# Patient Record
Sex: Female | Born: 1945
Health system: Southern US, Community
[De-identification: ages and names within clinical notes are randomized; demographics above are authoritative.]

## PROBLEM LIST (undated history)

## (undated) DIAGNOSIS — E785 Hyperlipidemia, unspecified: Secondary | ICD-10-CM

## (undated) DIAGNOSIS — I359 Nonrheumatic aortic valve disorder, unspecified: Secondary | ICD-10-CM

## (undated) DIAGNOSIS — R0683 Snoring: Secondary | ICD-10-CM

## (undated) DIAGNOSIS — I10 Essential (primary) hypertension: Secondary | ICD-10-CM

## (undated) DIAGNOSIS — L509 Urticaria, unspecified: Secondary | ICD-10-CM

## (undated) DIAGNOSIS — G43909 Migraine, unspecified, not intractable, without status migrainosus: Secondary | ICD-10-CM

## (undated) DIAGNOSIS — F329 Major depressive disorder, single episode, unspecified: Secondary | ICD-10-CM

## (undated) DIAGNOSIS — M1990 Unspecified osteoarthritis, unspecified site: Secondary | ICD-10-CM

## (undated) DIAGNOSIS — K644 Residual hemorrhoidal skin tags: Secondary | ICD-10-CM

## (undated) DIAGNOSIS — G4733 Obstructive sleep apnea (adult) (pediatric): Secondary | ICD-10-CM

## (undated) DIAGNOSIS — F32A Depression, unspecified: Secondary | ICD-10-CM

## (undated) DIAGNOSIS — R413 Other amnesia: Secondary | ICD-10-CM

## (undated) DIAGNOSIS — G4730 Sleep apnea, unspecified: Secondary | ICD-10-CM

## (undated) DIAGNOSIS — K219 Gastro-esophageal reflux disease without esophagitis: Secondary | ICD-10-CM

## (undated) DIAGNOSIS — Z95818 Presence of other cardiac implants and grafts: Secondary | ICD-10-CM

## (undated) DIAGNOSIS — R234 Changes in skin texture: Secondary | ICD-10-CM

## (undated) HISTORY — PX: FACIAL COSMETIC SURGERY: SHX629

## (undated) HISTORY — DX: Nonrheumatic aortic valve disorder, unspecified: I35.9

## (undated) HISTORY — DX: Other amnesia: R41.3

## (undated) HISTORY — DX: Hyperlipidemia, unspecified: E78.5

## (undated) HISTORY — DX: Migraine, unspecified, not intractable, without status migrainosus: G43.909

## (undated) HISTORY — PX: BLADDER SURGERY: SHX569

## (undated) HISTORY — DX: Residual hemorrhoidal skin tags: K64.4

## (undated) HISTORY — DX: Snoring: R06.83

## (undated) HISTORY — PX: TOTAL ABDOMINAL HYSTERECTOMY: SHX209

## (undated) HISTORY — DX: Unspecified osteoarthritis, unspecified site: M19.90

## (undated) HISTORY — PX: HAMMER TOE SURGERY: SHX385

## (undated) HISTORY — PX: TUBAL LIGATION: SHX77

## (undated) HISTORY — DX: Gastro-esophageal reflux disease without esophagitis: K21.9

## (undated) HISTORY — DX: Sleep apnea, unspecified: G47.30

## (undated) HISTORY — PX: TONSILLECTOMY: SUR1361

## (undated) HISTORY — DX: Obstructive sleep apnea (adult) (pediatric): G47.33

---
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
## 1967-10-18 DIAGNOSIS — K644 Residual hemorrhoidal skin tags: Secondary | ICD-10-CM

## 1967-10-18 HISTORY — DX: Residual hemorrhoidal skin tags: K64.4

## 1999-02-23 ENCOUNTER — Encounter: Payer: Self-pay | Admitting: Gastroenterology

## 1999-07-06 ENCOUNTER — Other Ambulatory Visit: Admission: RE | Admit: 1999-07-06 | Discharge: 1999-07-06 | Payer: Self-pay | Admitting: Obstetrics and Gynecology

## 2000-03-29 ENCOUNTER — Encounter: Admission: RE | Admit: 2000-03-29 | Discharge: 2000-03-29 | Payer: Self-pay | Admitting: *Deleted

## 2000-03-29 ENCOUNTER — Encounter: Payer: Self-pay | Admitting: *Deleted

## 2000-11-20 ENCOUNTER — Other Ambulatory Visit: Admission: RE | Admit: 2000-11-20 | Discharge: 2000-11-20 | Payer: Self-pay | Admitting: Obstetrics and Gynecology

## 2001-04-02 ENCOUNTER — Emergency Department (HOSPITAL_COMMUNITY): Admission: EM | Admit: 2001-04-02 | Discharge: 2001-04-02 | Payer: Self-pay | Admitting: Emergency Medicine

## 2001-04-02 ENCOUNTER — Encounter: Payer: Self-pay | Admitting: Obstetrics and Gynecology

## 2001-04-11 ENCOUNTER — Encounter: Admission: RE | Admit: 2001-04-11 | Discharge: 2001-04-11 | Payer: Self-pay | Admitting: Obstetrics and Gynecology

## 2001-04-11 ENCOUNTER — Encounter: Payer: Self-pay | Admitting: Obstetrics and Gynecology

## 2002-01-22 ENCOUNTER — Other Ambulatory Visit: Admission: RE | Admit: 2002-01-22 | Discharge: 2002-01-22 | Payer: Self-pay | Admitting: Obstetrics and Gynecology

## 2002-05-12 ENCOUNTER — Encounter: Payer: Self-pay | Admitting: Emergency Medicine

## 2002-05-12 ENCOUNTER — Emergency Department (HOSPITAL_COMMUNITY): Admission: EM | Admit: 2002-05-12 | Discharge: 2002-05-12 | Payer: Self-pay | Admitting: Emergency Medicine

## 2002-08-01 ENCOUNTER — Encounter: Admission: RE | Admit: 2002-08-01 | Discharge: 2002-08-01 | Payer: Self-pay | Admitting: Obstetrics and Gynecology

## 2002-08-01 ENCOUNTER — Encounter: Payer: Self-pay | Admitting: Obstetrics and Gynecology

## 2003-05-22 ENCOUNTER — Other Ambulatory Visit: Admission: RE | Admit: 2003-05-22 | Discharge: 2003-05-22 | Payer: Self-pay | Admitting: Obstetrics and Gynecology

## 2004-05-07 ENCOUNTER — Encounter: Admission: RE | Admit: 2004-05-07 | Discharge: 2004-05-07 | Payer: Self-pay | Admitting: Obstetrics and Gynecology

## 2004-07-14 ENCOUNTER — Other Ambulatory Visit: Admission: RE | Admit: 2004-07-14 | Discharge: 2004-07-14 | Payer: Self-pay | Admitting: Obstetrics and Gynecology

## 2005-06-29 ENCOUNTER — Encounter: Admission: RE | Admit: 2005-06-29 | Discharge: 2005-06-29 | Payer: Self-pay | Admitting: Obstetrics and Gynecology

## 2005-08-04 ENCOUNTER — Emergency Department (HOSPITAL_COMMUNITY): Admission: EM | Admit: 2005-08-04 | Discharge: 2005-08-04 | Payer: Self-pay | Admitting: Emergency Medicine

## 2006-04-13 ENCOUNTER — Other Ambulatory Visit: Admission: RE | Admit: 2006-04-13 | Discharge: 2006-04-13 | Payer: Self-pay | Admitting: Obstetrics and Gynecology

## 2006-07-03 ENCOUNTER — Encounter: Admission: RE | Admit: 2006-07-03 | Discharge: 2006-07-03 | Payer: Self-pay | Admitting: Obstetrics and Gynecology

## 2007-10-03 ENCOUNTER — Encounter: Admission: RE | Admit: 2007-10-03 | Discharge: 2007-10-03 | Payer: Self-pay | Admitting: Obstetrics and Gynecology

## 2007-10-18 HISTORY — PX: TOTAL KNEE ARTHROPLASTY: SHX125

## 2007-10-24 ENCOUNTER — Other Ambulatory Visit: Admission: RE | Admit: 2007-10-24 | Discharge: 2007-10-24 | Payer: Self-pay | Admitting: Obstetrics and Gynecology

## 2007-11-01 ENCOUNTER — Encounter: Admission: RE | Admit: 2007-11-01 | Discharge: 2007-11-01 | Payer: Self-pay | Admitting: Neurology

## 2007-11-22 ENCOUNTER — Encounter: Admission: RE | Admit: 2007-11-22 | Discharge: 2007-11-22 | Payer: Self-pay | Admitting: Neurology

## 2008-01-28 ENCOUNTER — Encounter: Admission: RE | Admit: 2008-01-28 | Discharge: 2008-01-28 | Payer: Self-pay | Admitting: Neurology

## 2008-08-13 ENCOUNTER — Inpatient Hospital Stay (HOSPITAL_COMMUNITY): Admission: RE | Admit: 2008-08-13 | Discharge: 2008-08-16 | Payer: Self-pay | Admitting: Orthopedic Surgery

## 2008-11-12 ENCOUNTER — Ambulatory Visit: Payer: Self-pay | Admitting: Cardiology

## 2008-11-17 ENCOUNTER — Ambulatory Visit: Payer: Self-pay

## 2008-11-27 ENCOUNTER — Encounter: Payer: Self-pay | Admitting: Cardiology

## 2008-11-27 ENCOUNTER — Ambulatory Visit: Payer: Self-pay

## 2008-12-17 ENCOUNTER — Ambulatory Visit: Payer: Self-pay | Admitting: Cardiology

## 2009-01-22 ENCOUNTER — Encounter (INDEPENDENT_AMBULATORY_CARE_PROVIDER_SITE_OTHER): Payer: Self-pay | Admitting: *Deleted

## 2009-03-17 DIAGNOSIS — E785 Hyperlipidemia, unspecified: Secondary | ICD-10-CM | POA: Insufficient documentation

## 2009-03-18 ENCOUNTER — Ambulatory Visit: Payer: Self-pay | Admitting: Pulmonary Disease

## 2009-03-18 DIAGNOSIS — R0989 Other specified symptoms and signs involving the circulatory and respiratory systems: Secondary | ICD-10-CM

## 2009-03-18 DIAGNOSIS — R0609 Other forms of dyspnea: Secondary | ICD-10-CM

## 2009-04-30 ENCOUNTER — Other Ambulatory Visit: Admission: RE | Admit: 2009-04-30 | Discharge: 2009-04-30 | Payer: Self-pay | Admitting: Obstetrics and Gynecology

## 2009-04-30 ENCOUNTER — Ambulatory Visit: Payer: Self-pay | Admitting: Obstetrics and Gynecology

## 2009-04-30 ENCOUNTER — Encounter: Payer: Self-pay | Admitting: Obstetrics and Gynecology

## 2009-05-28 ENCOUNTER — Encounter: Admission: RE | Admit: 2009-05-28 | Discharge: 2009-05-28 | Payer: Self-pay | Admitting: Obstetrics and Gynecology

## 2009-07-17 ENCOUNTER — Encounter: Payer: Self-pay | Admitting: Gastroenterology

## 2009-07-21 ENCOUNTER — Ambulatory Visit: Payer: Self-pay | Admitting: Gastroenterology

## 2009-09-14 ENCOUNTER — Encounter: Payer: Self-pay | Admitting: Gastroenterology

## 2010-06-14 ENCOUNTER — Ambulatory Visit: Payer: Self-pay | Admitting: Pulmonary Disease

## 2010-06-14 DIAGNOSIS — M5030 Other cervical disc degeneration, unspecified cervical region: Secondary | ICD-10-CM | POA: Insufficient documentation

## 2010-06-14 DIAGNOSIS — G43909 Migraine, unspecified, not intractable, without status migrainosus: Secondary | ICD-10-CM | POA: Insufficient documentation

## 2010-06-14 DIAGNOSIS — R059 Cough, unspecified: Secondary | ICD-10-CM | POA: Insufficient documentation

## 2010-06-14 DIAGNOSIS — K5730 Diverticulosis of large intestine without perforation or abscess without bleeding: Secondary | ICD-10-CM | POA: Insufficient documentation

## 2010-06-14 DIAGNOSIS — M1710 Unilateral primary osteoarthritis, unspecified knee: Secondary | ICD-10-CM

## 2010-06-14 DIAGNOSIS — I359 Nonrheumatic aortic valve disorder, unspecified: Secondary | ICD-10-CM | POA: Insufficient documentation

## 2010-06-14 DIAGNOSIS — R1312 Dysphagia, oropharyngeal phase: Secondary | ICD-10-CM | POA: Insufficient documentation

## 2010-06-14 DIAGNOSIS — K219 Gastro-esophageal reflux disease without esophagitis: Secondary | ICD-10-CM | POA: Insufficient documentation

## 2010-06-14 DIAGNOSIS — M179 Osteoarthritis of knee, unspecified: Secondary | ICD-10-CM | POA: Insufficient documentation

## 2010-06-14 DIAGNOSIS — R05 Cough: Secondary | ICD-10-CM

## 2010-06-16 ENCOUNTER — Ambulatory Visit (HOSPITAL_COMMUNITY): Admission: RE | Admit: 2010-06-16 | Discharge: 2010-06-16 | Payer: Self-pay | Admitting: Pulmonary Disease

## 2010-06-16 ENCOUNTER — Encounter: Payer: Self-pay | Admitting: Pulmonary Disease

## 2010-07-01 ENCOUNTER — Encounter: Admission: RE | Admit: 2010-07-01 | Discharge: 2010-07-01 | Payer: Self-pay | Admitting: Internal Medicine

## 2010-07-07 ENCOUNTER — Encounter: Payer: Self-pay | Admitting: Pulmonary Disease

## 2010-11-07 ENCOUNTER — Encounter: Payer: Self-pay | Admitting: Obstetrics and Gynecology

## 2010-11-07 ENCOUNTER — Encounter: Payer: Self-pay | Admitting: Neurology

## 2010-11-14 ENCOUNTER — Encounter: Payer: Self-pay | Admitting: Pulmonary Disease

## 2010-11-15 ENCOUNTER — Ambulatory Visit
Admission: RE | Admit: 2010-11-15 | Discharge: 2010-11-15 | Payer: Self-pay | Source: Home / Self Care | Attending: Pulmonary Disease | Admitting: Pulmonary Disease

## 2010-11-15 DIAGNOSIS — G4733 Obstructive sleep apnea (adult) (pediatric): Secondary | ICD-10-CM | POA: Insufficient documentation

## 2010-11-16 NOTE — Assessment & Plan Note (Addendum)
Summary: aspiration/ok per SN/Dr. Norval Gable wife/apc   Primary Care Provider:  Dr. Timothy Lasso   CC:  Add-on for ?aspiration episode last PM....  History of Present Illness: 65 y/o WF, wife of Dr. Sheryn Guerrero, who is a primary care patient of DrRusso's followed for GERD, Hyperlipidemia, and DJD... DrPatterson called this AM asking me to check his wife after a choking episode last night... they were at a restaurant & she choked on a piece of dry chicken resulting in a severe coughing paroxysm but at no time did she have stridor or require a heimlich maneuver etc... she describes continued coughing thru the night, w/ choking & gagging, assoc w/ incr SOB & some wheezing noted... she has not been producing any sputum or food particles, no hemoptysis, etc... on further questioning she describes several months of intermittent choking on food (ave once per week) & occas coughs after swallowing pills... she feels like there is something in her upper esoph & she clears her throat alot, she says...  she has been on Aciphex daily for reflux symptoms and saw ENT in the past for this... however she notes that her swallowing is OK, & denies regurgitation of food etc... she also has a hx of CSpine disc dis w/ prev epid steroid shots, but no know osteopytes that could impact her swallowing... she is an ex-smoker having smoked off & on for yrs & quit in 2005.   Current Problems:   SNORING (ICD-786.09) - prev eval 6/10 by DrClance.  COUGH (ICD-786.2) **SEE ABOVE**  ~  CXR 8/11 showed clear lungs, NAD...  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  DIVERTICULOSIS OF COLON (ICD-562.10) - last colonoscopy 10/10 by DrStark showed mild divertics, otherw neg...  DEGENERATIVE JOINT DISEASE (ICD-715.90) - she takes  CELEBREX 200mg  Prn and had a right TKR by DrAlusio 10/09  DEGENERATIVE DISC DISEASE, CERVICAL SPINE (ICD-722.4) - prev eval by Autumn Patty & she received epid steroid injections by IR in 2009  Hx of MIGRAINE HEADACHE (ICD-346.90) - prev eval by DrLove   Preventive Screening-Counseling & Management  Alcohol-Tobacco     Smoking Status: quit     Packs/Day: 1.0     Year Started: 1962     Year Quit: 2005  Allergies (verified): No Known Drug Allergies  Comments:  Nurse/Medical Assistant: The patient's medications and allergies were reviewed with the patient and were updated in the Medication and Allergy Lists.  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 Mother  Alzheimers, hyperlipidemia Father  High HDL, type II DM Two brother Factor IX deficiency  allergies: children, father, brother asthma: grandchildren, father rheumatism: maternal grandmother   Social History: Reviewed history from 03/18/2009 and no changes required. Married, 3 children Former smoker.  started at age 81.  smoked "on and off" approx 1 ppd.  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 pt lives with husband, Dr. Sheryn Guerrero.    Review of Systems      See HPI  The patient complains of prolonged cough.  The patient denies anorexia, fever, weight loss, weight gain, vision loss, decreased hearing, hoarseness, chest pain, syncope, dyspnea on exertion, peripheral edema, headaches, hemoptysis,  abdominal pain, melena, hematochezia, severe indigestion/heartburn, hematuria, incontinence, muscle weakness, suspicious skin lesions, transient blindness, difficulty walking, depression, unusual weight change, abnormal bleeding, enlarged lymph nodes, and angioedema.    Vital Signs:  Patient profile:   64 year old female Height:      66 inches Weight:      153 pounds BMI:     24.78 O2 Sat:      98 % on Room air Temp:     100.4 degrees F oral Pulse rate:   83 / minute BP sitting:   116 / 74  (left arm) Cuff size:   regular  Vitals Entered By: Randell Loop CMA (June 14, 2010 12:07 PM)  O2 Sat at Rest %:  98 O2 Flow:  Room air CC: Add-on for ?aspiration episode last PM... Is Patient Diabetic? No Pain Assessment Patient in pain? no      Comments meds updated today with pt   Physical Exam  Additional Exam:  WD, WN, 65 y/o WF in NAD... GENERAL:  Alert & oriented; pleasant & cooperative... HEENT:  [place]/AT, EOM-wnl, PERRLA, EACs-clear, TMs-wnl, NOSE-clear, THROAT- sl red, no lesions. NECK:  Supple w/ fairROM; no JVD; normal carotid impulses w/o bruits; no thyromegaly or nodules palpated; no lymphadenopathy. CHEST:  Clear to P & A; without wheezes/ rales/ or rhonchi heard... able to get good deep breath w/o coughing. HEART:  Regular Rhythm; without murmurs/ rubs/ or gallops detected... ABDOMEN:  Soft & nontender; normal bowel sounds; no organomegaly or masses palpated... EXT:  s/p right TKR, mild arthritic changes; no varicose veins/ venous insuffic/ or edema. NEURO:  CN's intact; motor testing normal; sensory testing normal; gait normal & balance OK. DERM:  No lesions noted; no rash etc...    CXR  Procedure date:  06/14/2010  Findings:      CHEST - 2 VIEW Comparison: None.   Findings: The lungs are clear.  Heart size is normal.  No pleural effusion or focal bony abnormality.   IMPRESSION: No acute disease.   Read By:  Charyl Dancer,  M.D.   Impression &  Recommendations:  Problem # 1:  Choking episode w/ residual cough>>> By her hx she had a severe choking episode last PM & she may have aspirated a sm piece of the chicken; but she has min cough at present and no airway inflamm signs w/ clear exam, able to take deep breath w/o cough, clear CXR, etc... we discussed pos bronchoscopy but I doubt any residual material in her tracheobronch tree & we agreed to hold off on this procedure for now... we will pursue eval of her dysphagia, and write a perscription for Prn Tussionex... Other Orders: T-2 View CXR (71020TC)  Problem # 2:  DYSPHAGIA OROPHARYNGEAL PHASE (ZOX-096.04) She has several months worth of intermittent dysphagia, choking, ?swallowing difficulty & we will proceed w/ MBS first... we left open the poss that she may require an EGD from DrStark... Orders: No Charge Patient Arrived (NCPA0) (NCPA0) Mod Barium Swallow (Mod Barium Swallow)  Problem # 3:  GERD (ICD-530.81) REC to increase the Aciphex to Bid in the interim... Her updated medication list for this problem includes:    Aciphex 20 Mg Tbec (Rabeprazole sodium) .Marland Kitchen... Take 1 tablet by mouth twice daily- 30 min before breakfast & dinner...  Problem # 4:  DIVERTICULOSIS OF COLON (ICD-562.10) She had a neg colonoscopy 10/10 by DrStark...  Problem # 5:  HYPERLIPIDEMIA (ICD-272.4) Followed by Etheleen Sia... Her updated medication list for this problem includes:    Crestor 40 Mg Tabs (Rosuvastatin calcium) .Marland Kitchen... Take 1 tablet by mouth once a day  Problem # 6:  DEGENERATIVE JOINT DISEASE (ICD-715.90) She is s/p right TKR in 2009, and has known cervical disc disease as well... Her updated medication list for this problem includes:    Celebrex 200 Mg Caps (Celecoxib) .Marland Kitchen... 1 two times a day  Complete Medication List: 1)  Crestor 40 Mg Tabs (Rosuvastatin calcium) .... Take 1 tablet by mouth once a day 2)  Aciphex 20 Mg Tbec (Rabeprazole sodium) .... Take 1 tablet by mouth twice daily- 30  min before breakfast & dinner... 3)  Celebrex 200 Mg Caps (Celecoxib) .Marland Kitchen.. 1 two times a day 4)  Fluoxetine Hcl 40 Mg Caps (Fluoxetine hcl) .... Take 1 tablet by mouth once a day 5)  Budeprion Xl 150 Mg Xr24h-tab (Bupropion hcl) .... Take 1 tablet by mouth once a day 6)  Estradiol Patch  .... Use as directed 7)  Tussionex Pennkinetic Er 8-10 Mg/73ml Lqcr (Chlorpheniramine-hydrocodone) .Marland Kitchen.. 1 tsp by mouth every 12 h as needed for cough  Patient Instructions: 1)  Today we updated your med list- see below.... 2)  Let's increase your Aciphex to one tab twice daily- taken 30 min before breakfast & dinner... 3)  We will sched a modified barium swallow test to evaluate your swallowing mechanism & call you w/ the results when avail...    Appended Document: Orders Update    Clinical Lists Changes  Orders: Added new Referral order of Misc. Referral (Misc. Ref) - Signed

## 2010-11-19 ENCOUNTER — Ambulatory Visit (INDEPENDENT_AMBULATORY_CARE_PROVIDER_SITE_OTHER): Payer: Commercial Managed Care - PPO | Admitting: Pulmonary Disease

## 2010-11-19 ENCOUNTER — Encounter: Payer: Self-pay | Admitting: Pulmonary Disease

## 2010-11-19 DIAGNOSIS — G4733 Obstructive sleep apnea (adult) (pediatric): Secondary | ICD-10-CM

## 2010-11-24 NOTE — Assessment & Plan Note (Signed)
Summary: home sleep testing with AHI 15/hr   Primary Provider/Referring Provider:  Dr. Timothy Lasso    History of Present Illness: Home Sleep Testing Report  The pt underwent home sleep testing with a type 3 device, with monitoring of airflow, effort, oxygen saturation, and heart rate.  The pt's data and tracings have been reviewed, with the following findings:  1) flow evaluation period of 7hrs and , and saturation evaluation period of 8hrs and . 2) the pt had 69 obstructive apneas, 16 central apneas, and 28 obstructive hypopneas.  This gave her         an AHI 15/hr. 3) there was oxygen desaturation as low as 74% transiently, and she spent during the night less       than or equal to 88%.  Allergies: No Known Drug Allergies   Impression & Recommendations:  Problem # 1:  OBSTRUCTIVE SLEEP APNEA (ICD-327.23)  mild osa documented by her recent home sleep study, with AHI 15/hr with desat as low as 74%.  Treatments for this can include a trial of weight loss if applicable, ua surgery, dental appliance, and cpap.  Clinical correlation is suggested.    Other Orders: Sleep Std Airflow/Heartrate and O2 SAT unattended ([id number])  Appended Document: home sleep testing with AHI 15/hr megan, please call pt and have her come in for ov to discuss her sleep study and various treatment options.  Appended Document: home sleep testing with AHI 15/hr [id number]  Appended Document: home sleep testing with AHI 15/hr called and spoke with pt. pt scheduled to see [facility] Friday 11-19-2010 at 11:30

## 2010-12-08 NOTE — Assessment & Plan Note (Signed)
Summary: ov to discuss sleep study results/mg   Vital Signs:  Patient profile:   65 year old female Height:      66 inches Weight:      157.38 pounds BMI:     25.49 O2 Sat:      99 % on Room air Temp:     98.8 degrees F oral Pulse rate:   82 / minute BP sitting:   122 / 78  (left arm) Cuff size:   regular  Vitals Entered By: Arman Filter LPN (November 19, 2010 11:46 AM)  O2 Flow:  Room air CC: Ov to discuss apnea link results.  Comments Medications reviewed with patient Arman Filter LPN  November 19, 2010 11:48 AM    Copy to:  Love Primary Provider/Referring Provider:  Kriste Basque  CC:  Ov to discuss apnea link results. .  History of Present Illness: the pt comes in today for f/u of her recent sleep study.  She was found to have mild osa,  with AHI 15/hr and desat to 74%.  I have reviewed the results with her and husband, and answered all of their questions.    Current Medications (verified): 1)  Crestor 40 Mg Tabs (Rosuvastatin Calcium) .... Take 1 Tablet By Mouth Once A Day 2)  Aciphex 20 Mg Tbec (Rabeprazole Sodium) .... Take 1 Tablet By Mouth Twice Daily- 30 Min Before Breakfast & Dinner... 3)  Celebrex 200 Mg Caps (Celecoxib) .Marland Kitchen.. 1 Two Times A Day 4)  Fluoxetine Hcl 40 Mg Caps (Fluoxetine Hcl) .... Take 1 Tablet By Mouth Once A Day 5)  Budeprion Xl 150 Mg Xr24h-Tab (Bupropion Hcl) .... Take 1 Tablet By Mouth Once A Day 6)  Estradiol Patch .... Use As Directed  Allergies (verified): No Known Drug Allergies  Review of Systems       The patient complains of acid heartburn, indigestion, and joint stiffness or pain.  The patient denies shortness of breath with activity, shortness of breath at rest, productive cough, non-productive cough, coughing up blood, chest pain, irregular heartbeats, loss of appetite, weight change, abdominal pain, difficulty swallowing, sore throat, tooth/dental problems, headaches, nasal congestion/difficulty breathing through nose, sneezing,  itching, ear ache, anxiety, depression, hand/feet swelling, rash, change in color of mucus, and fever.    Physical Exam  General:  wd female in nad. Nose:  mild septal deviation to left with narrowing, but patent Mouth:  mild elongation of soft palate, but normal uvula Neurologic:  alert, does not appear overly sleepy   Impression & Recommendations:  Problem # 1:  OBSTRUCTIVE SLEEP APNEA (ICD-327.23) the pt has mild osa by her recent sleep study, but is clearly symptomatic with sleep disruption, daytime sleepiness, and some memory issues that may be related.  I have reviewed the various treatment options, including weight reduction, upper airway surgery, dental appliance, and cpap.  After discussing at length, they have decided to start with cpap and see how she does.  Will start at a low pressure level for better tolerance, and have reviewed the various types of masks as well.   Other Orders: No Charge Patient Arrived (NCPA0) (NCPA0) DME Referral (DME)  Patient Instructions: 1)  will set up on cpap.  Please call if tolerance issues 2)  followup with me in 5 weeks.   Orders Added: 1)  No Charge Patient Arrived (NCPA0) [NCPA0] 2)  DME Referral [DME]

## 2010-12-24 ENCOUNTER — Encounter: Payer: Self-pay | Admitting: Pulmonary Disease

## 2010-12-24 ENCOUNTER — Ambulatory Visit: Payer: Commercial Managed Care - PPO | Admitting: Pulmonary Disease

## 2011-01-05 ENCOUNTER — Ambulatory Visit: Payer: Commercial Managed Care - PPO | Admitting: Pulmonary Disease

## 2011-01-17 ENCOUNTER — Ambulatory Visit (INDEPENDENT_AMBULATORY_CARE_PROVIDER_SITE_OTHER): Payer: Commercial Managed Care - PPO | Admitting: Gastroenterology

## 2011-01-17 ENCOUNTER — Other Ambulatory Visit (INDEPENDENT_AMBULATORY_CARE_PROVIDER_SITE_OTHER): Payer: Commercial Managed Care - PPO

## 2011-01-17 ENCOUNTER — Other Ambulatory Visit: Payer: Self-pay | Admitting: Gastroenterology

## 2011-01-17 ENCOUNTER — Encounter: Payer: Self-pay | Admitting: Gastroenterology

## 2011-01-17 VITALS — BP 128/64 | HR 72 | Ht 65.0 in | Wt 157.0 lb

## 2011-01-17 DIAGNOSIS — R197 Diarrhea, unspecified: Secondary | ICD-10-CM

## 2011-01-17 LAB — HEPATIC FUNCTION PANEL
Albumin: 3.9 g/dL (ref 3.5–5.2)
Total Protein: 6.7 g/dL (ref 6.0–8.3)

## 2011-01-17 LAB — BASIC METABOLIC PANEL
BUN: 13 mg/dL (ref 6–23)
CO2: 27 mEq/L (ref 19–32)
Calcium: 9.4 mg/dL (ref 8.4–10.5)
Creatinine, Ser: 1.1 mg/dL (ref 0.4–1.2)
GFR: 56.03 mL/min — ABNORMAL LOW (ref >60.00)
Glucose, Bld: 106 mg/dL — ABNORMAL HIGH (ref 70–99)
Sodium: 138 mEq/L (ref 135–145)

## 2011-01-17 NOTE — Patient Instructions (Signed)
Go directly to the basement today to have your labs drawn.  Follow instructions on [facility] cards and mail back to [place] when finished.

## 2011-01-17 NOTE — Progress Notes (Signed)
History of Present Illness:  Angelica Guerrero is a 65 year old female who relates that he six-month history of urgent, watery diarrhea associated with lower abdominal crampy pain and generalized abdominal bloating. Her symptoms generally occur 30 minutes after meals. She states she is extremely sensitive to sucralose, sorbitol and similar alcohol sugars. Even eating small amounts of these products leads to significant diarrhea that is not controlled even with Imodium. She has recently noted sensitivity to lactose as well which is new over the past few months. She notes no rectal bleeding, weight loss, change in stool caliber, nausea, vomiting, anorexia, fevers or chills. She took a course of Septra several months ago without any change in symptoms. Only recent travel has been to [place]   Current Medications, Allergies, Past Medical History, Past Surgical History, Family History and Social History were reviewed in [employer] record.  Physical Exam: General: Well developed , well nourished, no acute distress Head: Normocephalic and atraumatic Eyes:  sclerae anicteric, EOMI Ears: Normal auditory acuity Mouth: No deformity or lesions Lungs: Clear throughout to auscultation Heart: Regular rate and rhythm; no murmurs, rubs or bruits Abdomen: Soft, non tender and non distended. No masses, hepatosplenomegaly or hernias noted. Normal Bowel sounds Musculoskeletal: Symmetrical with no gross deformities  Pulses:  Normal pulses noted Extremities: No clubbing, cyanosis, edema or deformities noted Neurological: Alert oriented x 4, grossly nonfocal Psychological:  Alert and cooperative. Normal mood and affect  Assessment and Recommendations:

## 2011-01-17 NOTE — Assessment & Plan Note (Signed)
Worsening diarrhea for 6 months. A component of her symptoms is clearly related to sucralose and alcohol sugars. She is attempting to strictly avoid these products. She may have a component of lactose intolerance as well and is advised to continue a lactose-free diet and/or to use Lactaid pills. Rule out a superimposed chronic intestinal infection, such as Giardia, celiac disease, microscopic colitis and other disorders. See orders for stool studies blood work and stool Hemoccults. Consider a trial of metronidazole and a colonoscopy with biopsies to evaluate for microscopic colitis if the stool studies and blood work are unrevealing. Continue Imodium 3 times a day as needed for now. Will be in contact with her when her blood work and stool studies return to make plans for course of metronidazole or colonoscopy or both.

## 2011-01-18 ENCOUNTER — Encounter: Payer: Self-pay | Admitting: Gastroenterology

## 2011-01-18 LAB — GLIA (IGA/G) + TTG IGA
Gliadin IgA: 4.5 U/mL (ref <20)
Gliadin IgG: 7.1 U/mL (ref <20)
Tissue Transglutaminase Ab, IgA: 4.9 U/mL (ref <20)

## 2011-01-18 LAB — C-REACTIVE PROTEIN: CRP: 0.1 mg/dL (ref <0.6)

## 2011-01-19 ENCOUNTER — Encounter: Payer: Self-pay | Admitting: Gastroenterology

## 2011-01-19 ENCOUNTER — Telehealth: Payer: Self-pay

## 2011-01-19 ENCOUNTER — Other Ambulatory Visit: Payer: Self-pay | Admitting: Gastroenterology

## 2011-01-19 ENCOUNTER — Other Ambulatory Visit: Payer: Commercial Managed Care - PPO

## 2011-01-19 DIAGNOSIS — R197 Diarrhea, unspecified: Secondary | ICD-10-CM

## 2011-01-19 NOTE — Telephone Encounter (Signed)
Spoke with patient and scheduled her for a Colonoscopy on 01/27/11 at 8:00am. Pt verbalized understanding. Pt's husband (Dr. Sheryn Bison) will take home paper work for her to fill out and return back to the office.

## 2011-01-20 LAB — CLOSTRIDIUM DIFFICILE BY PCR: Toxigenic C. Difficile by PCR: NOT DETECTED

## 2011-01-20 LAB — OVA AND PARASITE SCREEN: OP: NONE SEEN

## 2011-01-20 LAB — GIARDIA/CRYPTOSPORIDIUM (EIA): Cryptosporidium Screen (EIA): NEGATIVE

## 2011-01-21 LAB — FECAL FAT QUALITATIVE
Free Fatty Acids: NORMAL
NEUTRAL FAT: NORMAL

## 2011-01-23 LAB — STOOL CULTURE

## 2011-01-26 ENCOUNTER — Encounter: Payer: Self-pay | Admitting: Gastroenterology

## 2011-01-27 ENCOUNTER — Encounter: Payer: Self-pay | Admitting: Gastroenterology

## 2011-01-27 ENCOUNTER — Other Ambulatory Visit: Payer: Commercial Managed Care - PPO | Admitting: Gastroenterology

## 2011-01-27 ENCOUNTER — Ambulatory Visit (AMBULATORY_SURGERY_CENTER): Payer: 59 | Admitting: Gastroenterology

## 2011-01-27 VITALS — BP 130/72 | HR 69 | Temp 98.2°F | Resp 16 | Ht 65.5 in | Wt 153.0 lb

## 2011-01-27 DIAGNOSIS — R197 Diarrhea, unspecified: Secondary | ICD-10-CM

## 2011-01-27 DIAGNOSIS — K5730 Diverticulosis of large intestine without perforation or abscess without bleeding: Secondary | ICD-10-CM

## 2011-01-27 DIAGNOSIS — R198 Other specified symptoms and signs involving the digestive system and abdomen: Secondary | ICD-10-CM

## 2011-01-27 MED ORDER — SODIUM CHLORIDE 0.9 % IV SOLN: 500.0000 mL | INTRAVENOUS | Status: DC

## 2011-01-28 ENCOUNTER — Telehealth: Payer: Self-pay | Admitting: *Deleted

## 2011-01-28 NOTE — Telephone Encounter (Signed)

## 2011-02-01 ENCOUNTER — Encounter: Payer: Self-pay | Admitting: Gastroenterology

## 2011-02-01 ENCOUNTER — Encounter: Payer: Self-pay | Admitting: Pulmonary Disease

## 2011-02-02 ENCOUNTER — Encounter: Payer: Self-pay | Admitting: Pulmonary Disease

## 2011-02-02 ENCOUNTER — Ambulatory Visit (INDEPENDENT_AMBULATORY_CARE_PROVIDER_SITE_OTHER): Payer: 59 | Admitting: Pulmonary Disease

## 2011-02-02 ENCOUNTER — Telehealth: Payer: Self-pay

## 2011-02-02 VITALS — BP 110/68 | HR 69 | Temp 98.4°F | Ht 66.0 in | Wt 164.0 lb

## 2011-02-02 DIAGNOSIS — G4733 Obstructive sleep apnea (adult) (pediatric): Secondary | ICD-10-CM

## 2011-02-02 MED ORDER — BUDESONIDE 3 MG PO CP24: ORAL_CAPSULE | ORAL | Status: DC

## 2011-02-02 NOTE — Progress Notes (Signed)
  Subjective:    Patient ID: Angelica Guerrero, female    DOB: 1946/05/23, 65 y.o.   MRN: [id number]  HPI The pt comes in today for f/u of her mild osa.  She has been wearing cpap compliantly, and feels that it has helped her sleep and daytime alertness.  No issues with pressure or mask fit.  She is, however, bothered by the inconvenience with cpap.  She is considering referral for dental appliance.    Review of Systems  Constitutional: Negative for fever and unexpected weight change.  HENT: Negative for ear pain, nosebleeds, congestion, sore throat, rhinorrhea, sneezing, trouble swallowing, dental problem, postnasal drip and sinus pressure.   Eyes: Positive for redness and itching.  Respiratory: Negative for cough, chest tightness, shortness of breath and wheezing.   Cardiovascular: Negative for palpitations and leg swelling.  Gastrointestinal: Negative for nausea and vomiting.  Genitourinary: Negative for dysuria.  Musculoskeletal: Negative for joint swelling.  Skin: Negative for rash.  Neurological: Negative for headaches.  Hematological: Does not bruise/bleed easily.  Psychiatric/Behavioral: Negative for dysphoric mood. The patient is not nervous/anxious.        Objective:   Physical Exam Wd female in nad No skin breakdown or pressure necrosis from cpap mask  Alert, not sleepy, moves all 4        Assessment & Plan:

## 2011-02-02 NOTE — Assessment & Plan Note (Signed)
The pt is doing better with cpap, and feels she is sleeping better with improved daytime alertness.  However, she does feel is inconvenient, esp with travel, and would like to consider dental appliance as well.  I asked her to call me when she is ready, and will refer to Althea Grimmer.  In the meantime, will put machine on auto for the next 2 weeks to optimize pressure.

## 2011-02-02 NOTE — Telephone Encounter (Signed)
Spoke with patient and informed her that Dr. Russella Dar wants her to continue Entocort for anther 5 weeks and taper down to 6 mg in 3 weeks, then reduce to 3 mg after 1 week, then discontinue. Pt verbalized understanding and prescription was sent to [facility].

## 2011-02-02 NOTE — Patient Instructions (Signed)
Will get machine changed over to auto mode for the next few weeks to optimize your pressure.  I will call you with the results, but let us know if you prefer the automatic mode. Please call if you would like to consider a dental referral for an appliance followup with me in 6mos

## 2011-02-04 ENCOUNTER — Inpatient Hospital Stay (INDEPENDENT_AMBULATORY_CARE_PROVIDER_SITE_OTHER)
Admission: RE | Admit: 2011-02-04 | Discharge: 2011-02-04 | Disposition: A | Discharge status: Home / Self Care | Payer: 59 | Source: Ambulatory Visit | Attending: Family Medicine | Admitting: Family Medicine

## 2011-02-04 DIAGNOSIS — S81809A Unspecified open wound, unspecified lower leg, initial encounter: Secondary | ICD-10-CM

## 2011-02-26 ENCOUNTER — Other Ambulatory Visit: Payer: Self-pay | Admitting: Pulmonary Disease

## 2011-02-26 DIAGNOSIS — G4733 Obstructive sleep apnea (adult) (pediatric): Secondary | ICD-10-CM

## 2011-03-01 NOTE — Op Note (Signed)
NAMEJOSIEPHINE, Angelica Guerrero            ACCOUNT NO.:  [id number]   MEDICAL RECORD NO.:  [id number]          PATIENT TYPE:  INP   LOCATION:  0010                         FACILITY:  [facility]   PHYSICIAN:  Ollen Gross, M.D.    DATE OF BIRTH:  [birth year]   DATE OF PROCEDURE:  08/13/2008  DATE OF DISCHARGE:                               OPERATIVE REPORT   PREOPERATIVE DIAGNOSIS:  Osteoarthritis right knee.   POSTOPERATIVE DIAGNOSIS:  Osteoarthritis right knee.   PROCEDURE:  Right total knee arthroplasty.   SURGEON:  Ollen Gross, M.D.   ASSISTANT:  Alexzandrew L. Perkins, P.A.C.   ANESTHESIA:  Spinal.   ESTIMATED BLOOD LOSS:  Minimal.   DRAIN:  None.   TOURNIQUET TIME:  30 minutes at 300 mmHg.   COMPLICATIONS:  None.   CONDITION:  Stable to recovery.   BRIEF CLINICAL NOTE:  Angelica Guerrero is a 65 year old female who has end-  stage arthritis of the right knee with progressively worsening pain and  dysfunction.  She has failed nonoperative management and presents now  for right total knee arthroplasty.   PROCEDURE IN DETAIL:  After the successful administration of spinal  anesthetic a tourniquet was placed high on her right thigh and right  lower extremity prepped and draped in the usual sterile fashion.  Extremity was wrapped in Esmarch, knee flexed, tourniquet inflated to  300 mmHg.  Midline incision is made with a 10 blade through subcutaneous  tissue to the level of the extensor mechanism.  A fresh blade is used to  make a medial parapatellar arthrotomy.  Soft tissue over the proximal  medial tibia subperiosteally elevated to the joint line with the knife  and into the semimembranosus bursa with a Cobb elevator.  Soft tissue  laterally is elevated with attention being paid to avoid the patellar  tendon on the tibial tubercle.  The patella was subluxed laterally, knee  flexed 90 degrees and ACL and PCL removed.  Drill was used to create a  starting hole in the distal femur and  the canal was thoroughly  irrigated.  The 5 degree right valgus alignment guide is placed and  referencing off the posterior condyles rotations was marked and a block  pinned to remove 10 mm of the distal femur.  Distal femoral resection is  made with an oscillating saw.  Sizing block was placed, size 4 narrow  was most appropriate.  She is 4 in the AP plane and 3 in the  mediolateral plane, thus a 4 narrow.  The size 4 cutting block is then  placed, rotation marked off the epicondylar axis.  The anterior-  posterior and chamfer cuts are made.   The tibia subluxed forward and menisci removed.  Extramedullary tibial  alignment guide is placed referencing proximally at the medial aspect of  the tibial tubercle and distally along the second metatarsal axis of the  tibial crest.  Block is pinned to remove about 8 mm from the less  deficient lateral side.  The tibial resection is made with an  oscillating saw.  Size 3 is the most appropriate tibial component and  the proximal tibia was prepared with the modular drill and keel punch  for a size 3.  Femoral preparation is completed with the intercondylar  cut for the size 4.   Size 3 mobile bearing tibial trial and size 4 narrow posterior  stabilized femoral trial and a 10 mm posterior stabilized rotating  platform insert trial are placed.  With the 10 full extension is  achieved with excellent varus-valgus anterior-posterior balance  throughout full range of motion.  The patella was then everted and  measured to be 15 mm.  This patella was quite worn.  Did a freehand  resection down to 11 mm.  A 35 template is placed, lug holes were  drilled, trial patella was placed and it tracks normally.  Osteophytes  removed off the posterior femur with the trial placed.  All trials were  removed and the cut bone surfaces are prepared with pulsatile lavage.  Cement was mixed and once ready for implantation a size 3 mobile bearing  tibial tray, a size 4  narrow posterior stabilized femur and 35 patella  are cemented into place.  The patella was held with the clamp.  Trial 10  mm insert was placed and knee held in full extension and all extruded  cement removed.  Once cement was fully hardened then the permanent 10 mm  posterior stabilized rotating platform insert is placed into the tibial  tray.  The wound was copiously irrigated with saline solution and  FloSeal was then injected on the posterior capsule, mediolateral gutters  and suprapatellar area.  Moist sponge was placed and tourniquet released  with a total time of 30 minutes.  The sponge was held for 2 minutes and  then removed.  Minimal bleeding was encountered.  The bleeding that is  encountered was stopped with electrocautery.  The wound was copiously  irrigated again with saline solution and the arthrotomy closed with  interrupted #1 PDS.  Flexion against gravity is about 140 degrees.  Subcu was closed with interrupted 2-0 Vicryl and subcuticular running 4-  0 Monocryl.  The incision was cleaned and dried and Steri-Strips and a  bulky sterile dressing applied.  She was then placed into a knee  immobilizer, awakened and transported to recovery in stable condition.      Ollen Gross, M.D.  Electronically Signed     FA/MEDQ  D:  08/13/2008  T:  08/13/2008  Job:  [id number]

## 2011-03-01 NOTE — Assessment & Plan Note (Signed)
[facility]                            CARDIOLOGY OFFICE NOTE   Angelica, Guerrero                   MRN:          [id number]  DATE:11/12/2008                            DOB:          24-Nov-1945    REFERRING PHYSICIAN:  Vania Rea. Jarold Motto, MD, Clementeen Graham, FACP, FAGA   CHIEF COMPLAINT:  Palpitations and chest pain.   HISTORY OF PRESENT ILLNESS:  Angelica Guerrero is a 65 year old female known to me  socially.  She is brought in by her husband who is a gastroenterologist  in our organization.  Her chief complaint is really palpitations.  She  has had palpitations mostly at night.  When this happens, her pulse is  irregular.  It has been particularly noticeable over the past 2 months.  When her husband checks on the pulse, he seems to think it is irregular.  When this occurs, she thinks she gets a little pinch in the left upper  chest.  She does not get sweaty, or nor does she have other typical  symptoms of ischemic-type symptoms.  The patient did have her knee  replaced in October, and at that time was on codeine, and Coumadin.  She  did not seem to have the symptoms at that time.  She has been using  Nicorette gum, because she smoked off and on from 2002 to 2003, but  currently is not doing that.  With exertion, she does not get particular  symptoms.   ALLERGIES:  No known drug allergies.   MEDICATIONS:  1. Prozac 20 mg daily.  2. Nicorette gum.  3. Bupropion 50 mg daily.  4. Aspirin 81 mg b.i.d.  5. Crestor 10 mg daily  6. Celebrex 200 mg b.i.d.  7. Aciphex 20 mg b.i.d.  8. Tylenol b.i.d.  9. Aleve.  10.Estradiol.   PAST MEDICAL HISTORY:  1. Prior facelift.  2. Hysterectomy.  3. Bladder tacking procedure.  4. The patient seems to be sure that she has some sleep apnea.  5. Prior echocardiography read by me 20 years ago suggested normal LV      systolic function.  There was mild bowing of the anterior leaflet      of the mitral valve suggesting  flattened closure, and there was      mild commissural thickening between the right and left coronary      cusps and there was trace aortic regurgitation at that time.  6. The patient has had herpes simplex of the lower lip.  7. The patient has had hyperlipidemia, with a very high HDL.  8. She had a knee replacement in November 2009 by Dr. Lequita Halt.   SOCIAL HISTORY:  The patient is married.  She currently does not smoke.  She has rarely smoked in the past, most recently in 2002 and 2003, but  she uses Nicorette gum.   Mother had hyperlipidemia and Alzheimer's.  Father had non-insulin-  dependent diabetes mellitus.  She has 2 fat brothers with factor XI  deficiency.  She also has a brother who has had a stent placed.  The  patient's mother is deceased with pancreatic cancer.  REVIEW OF SYSTEMS:  The patient has not had really significant weight  loss or weight gain, particularly.  She has not had specific GI  symptoms.  She has not had exertional chest pain.  There has been no  obvious rash.  She did have an exercise tolerance test by me in 1991 at  the age of 61 for some intermittent chest discomfort and had excellent  exercise tolerance.  Constipation, hiatal hernia, and gastroesophageal  reflux.  She has also had intermittently migraine headaches.   PHYSICAL EXAMINATION:  She is alert and oriented in no acute distress.  Weight is 154 pounds.  Blood pressure is 130/80 in the right arm and  130/80 in the left arm, the pulse is 80 and regular.  The carotid  upstrokes are brisk.  No bruits are noted.  There is no obvious jugular  venous distention.  Cranial nerves are grossly intact.  The lung fields  are clear to auscultation and percussion.  The PMI is not displaced.  There is not a significant murmur or rub that I can appreciate.  I do  not specifically appreciate an aortic regurgitation murmur.  Abdomen is  grossly normal, and there is no obvious extremity edema or other major   abnormalities.   Her electrocardiogram demonstrates normal sinus rhythm and is  essentially within normal limits.   IMPRESSION:  1. History of palpitations at night.  2. Irregular pulse documented by the patient's husband who is a      physician.  3. Intermittent atypical chest discomfort associated with rapid      irregular pulse.  4. History of hypercholesterolemia, on lipid-lowering therapy.  5. Remote tobacco history.  6. Status post knee replacement.  7. Gastroesophageal reflux.  8. Degenerative joint disease.   RECOMMENDATIONS:  1. Given her history of chest discomfort, her age, and her      hyperlipidemia, we will recommend a stress myocardial perfusion      imaging study to rule out significant ischemia.  2. We will recommend an echocardiographic study.  3. An event monitor to document arrhythmias would be important.  4. Consideration should be given to a sleep study.  5. We will have her return when all these things are available.     Arturo Morton. Riley Kill, MD, [facility]  Electronically Signed    TDS/MedQ  DD: 11/24/2008  DT: 11/24/2008  Job #: [id number]

## 2011-03-01 NOTE — Assessment & Plan Note (Signed)
[facility]                            CARDIOLOGY OFFICE NOTE   [name], [name]                   MRN:          [id number]  DATE:12/17/2008                            DOB:          [birth year]    Angelica Guerrero is in for a followup visit.  She is really doing pretty well.  No  acute abnormalities.  She clearly does have some episodes where her rate  has been faster, although on the monitor all we could demonstrate really  was sinus rhythm.  She underwent myocardial perfusion imaging.  The  patient exercised for 10-1/2 minutes, there were no diagnostic ST  changes, and there was a normal myocardial perfusion stress imaging  study.  In addition, the patient had a normal echocardiographic study  with trivial aortic regurgitation, none significant.   [name] and I had a thorough conversation today.  If she has any of these  episodes, I have encouraged her to go over either to Dave's office or to  ours, so we can get a rhythm.  She says most of it tends to occur at  night.  Nonetheless, we will need to see that.     Arturo Morton. Riley Kill, MD, [facility]  Electronically Signed    TDS/MedQ  DD: 12/28/2008  DT: 12/29/2008  Job #: [id number]

## 2011-03-04 NOTE — Discharge Summary (Signed)
NAMEDEASHIA, SOULE            ACCOUNT NO.:  [id number]   MEDICAL RECORD NO.:  [id number]          PATIENT TYPE:  INP   LOCATION:  1603                         FACILITY:  [facility]   PHYSICIAN:  Ollen Gross, M.D.    DATE OF BIRTH:  27-Oct-1945   DATE OF ADMISSION:  08/13/2008  DATE OF DISCHARGE:  08/16/2008                               DISCHARGE SUMMARY   CHIEF COMPLAINT:  Osteoarthritis ___right knee_______   ADMITTING DIAGNOSIS:  1. Osteoarthritis, right knee.  2. Migraines.  3. Shingles.  4. Hypercholesterolemia.  5. Possible remote history of sleep apnea (not undergone diagnostic      testing).   DISCHARGE DIAGNOSES:  1. Osteoarthritis, right knee, status post right total knee      replacement arthroplasty.  2. Mild postoperative blood-loss anemia, did not require transfusion.  3. Migraines.  4. Shingles.  5. Hypercholesterolemia.  6. Possible remote history of sleep apnea (not undergone diagnostic      testing).   PROCEDURE:  August 13, 2008, right total knee surgery, Dr. Despina Hick,  Assistant Avel Peace, P.A.-C., anesthesia spinal.   CONSULTS:  None.   BRIEF HISTORY:  Ms. Dimiceli is a 65 year old female with end-stage  osteoarthritis of the right knee, progressively worsening pain and  dysfunction, failed nonoperative management, now presents for total knee  arthroplasty.   LABORATORY DATA:  Preoperative CBC showed hemoglobin 13.5, hematocrit of  40.7, white cell count 5.2, platelets 242.  Postoperative hemoglobin  11.6, drifted down to 10.2, last H and H 9.8 and 29.0.  PT and PTT  preoperatively 13.2 and 27, respectively, INR 1.0.  Serial pro-times  followed per Coumadin protocol.  Last noted PT/INR 25.3 and 2.1.  Chem  panel on admission:  Slightly low albumin of 3.4, admitting chem panel  within normal limits.  Serial BMETs were followed.  Electrolytes  remained within normal limits.  Glucose went up from 111 to 151, back  down to 115.  Urinalysis  preoperatively negative.  Blood type A-  positive.   EKG, August 04, 2008:  Normal sinus rhythm, normal tracing, as  confirmed by Dr. Peter Swaziland.   HOSPITAL COURSE:  Patient was admitted to [facility],  tolerated procedure well, went to recovery room, orthopedic floor,  started on PCA and p.o. analgesic pain control following surgery, given  24-hour postoperative IV antibiotics, started on Coumadin for DVT  prophylaxis.  The patient was doing pretty well on the morning of day  #1, started getting up out of bed, had excellent urinary output.  Hemoglobin was stable.  By day #2, she was doing very well, got up and  walked about 130-200 feet.  Hemoglobin looked good.  Dressing was  changed.  Incision was healing well.  Had excellent urinary output  still.  Felt to be progressing well.  Weaned over to p.o. meds.  PCA and  Foley were discontinued.  By the following day she was walking over 400  feet, she was tolerating her meds and discharged home.   DISCHARGE PLANS:  Patient discharged home on August 16, 2008.   DISCHARGE DIAGNOSES:  Please see above.  DISCHARGE MEDICATIONS:  Percocet, Robaxin and Coumadin.   DIET:  Low-cholesterol diet.   FOLLOWUP:  Two weeks.   ACTIVITY:  Weightbearing as tolerated, total knee protocol, right lower  extremity.  Home with PT and  home nursing.   DISPOSITION:  Home.   DISCHARGE CONDITION:  Improved.      Alexzandrew L. Perkins, P.A.C.      Ollen Gross, M.D.  Electronically Signed    ALP/MEDQ  D:  10/01/2008  T:  10/01/2008  Job:  [id number]   cc:   Ollen Gross, M.D.  Fax: [phone]   Gwen Pounds, MD  Fax: [phone]

## 2011-03-04 NOTE — Consult Note (Signed)
[facility]  Patient:    Angelica Guerrero, Angelica Guerrero                     MRN: [id number] Attending:  Rande Brunt. Eda Paschal, M.D.                          Consultation Report  CHIEF COMPLAINT:  Left lower quadrant pain.  HISTORY OF PRESENT ILLNESS:  Patient is a 65 year old gravida 3, para 3, Ab0, who started to complain of left lower quadrant pain on Thursday.  As the weekend progressed, it started to get worse and worse.  She has been taking Darvocet at home with no response.  It does not really seem to change with position.  She has had no vomiting.  She has been a little bit nauseated.  She has had no urinary symptoms whatsoever.  She is status post TAH and bladder neck suspension done by me in 1994 with fibroids.  She had undergone an ultrasound in my office in February and she has just extremely small echo-free cysts on both ovaries of less than 1 cm.  She has had no other significant problems.  PAST MEDICAL HISTORY:  See above.  She has also had a tubal ligation.  PRESENT MEDICATIONS:  Effexor, estradiol, Celebrex and Imitrex.  CURRENT MEDICAL PROBLEMS:  Migraines and arthritis involving her knees, wrists and neck.  FAMILY HISTORY:  Grandmother, father and another grandparent are diabetic. Uncle had colon cancer.  Grandmother had uterine cancer.  She has paternal cousins with breast cancer and another family member with lung cancer.  SOCIAL HISTORY:  She is a nonsmoker, nondrinker.  REVIEW OF SYSTEMS:  HEENT:  Negative.  CARDIAC:  Negative.  RESPIRATORY: Negative.  GI:  Negative except for left lower quadrant pain and it is not associated with any diarrhea.  GU:  Negative.  NEUROMUSCULAR:  Negative. ENDOCRINE:  Negative.  PHYSICAL EXAMINATION:  GENERAL:  Patient is a well-developed, well-nourished female having acute left lower quadrant pain.  ABDOMEN:  Soft without guarding, rebound or masses.  PELVIC/RECTAL:  External within normal limits.  BUS  within normal limits. Vagina within normal limits.  Cervix and uterus are absent.  Adnexal and rectal examination failed to reveal any masses but she is very tender at the place of her left adnexa.  LABORATORY DATA:  Pelvic ultrasound failed to reveal any significant pathology.  She did have a very small echo-free cyst of the right ovary.  CBC and urinalysis were negative.  IMPRESSION:  Left lower quadrant pain of unknown etiology.  PLAN:  Patient felt ready to go home and therefore she was discharged with some Vicodin.  I will call her later in the afternoon to see how she is doing and we will continue the evaluation as appropriate. DD:  04/02/01 TD:  04/03/01 Job: [id number]

## 2011-03-04 NOTE — H&P (Signed)
NAMEMEOSHIA, BILLING            ACCOUNT NO.:  [id number]   MEDICAL RECORD NO.:  [id number]          PATIENT TYPE:  INP   LOCATION:  1603                         FACILITY:  [facility]   PHYSICIAN:  Ollen Gross, M.D.    DATE OF BIRTH:  [birth year]   DATE OF ADMISSION:  08/13/2008  DATE OF DISCHARGE:                              HISTORY & PHYSICAL   CHIEF COMPLAINT:  Right knee pain.   HISTORY OF PRESENT ILLNESS:  The patient is 65 year old female who has  seen by Dr. Lequita Halt for ongoing right knee pain.  She has known  arthritis, has been end-stage for quite some time now.  It is  progressively getting worse.  She is at a point where she would like to  have something done about it.  Risks and benefits have been discussed.  She elects to proceed with surgery.   ALLERGIES:  MORPHINE CAUSES HIVES.   CURRENT MEDICATIONS:  Fluoxetine, Bupropion, estradiol, Crestor,  Celebrex, Aciphex.   PAST MEDICAL HISTORY:  1. Migraines.  2. Shingles.  3. Hypercholesterolemia.  4. She states there is a possible remote history of sleep apnea,      although she has not had any diagnostic testing for this.   PAST SURGICAL HISTORY:  1. Hysterectomy.  2. Bladder tack procedure.   FAMILY HISTORY:  Father with history of diabetes and dementia.  Mother  deceased age 69 with history of diabetes.  Has two brothers, one which  has a cardiac stent.   SOCIAL HISTORY:  Married, housewife, past smoker.  No alcohol.  Three  children.  Family will be assisting with care after surgery.  She lives  in a [place] home.  She does have a living will.   REVIEW OF SYSTEMS:  GENERAL:  No fevers, chills, night sweats.  NEUROLOGICAL:  No seizures, syncope or paralysis.  RESPIRATORY:  Shortness breath, productive cough or hemoptysis.  CARDIOVASCULAR:  No  chest pain or orthopnea.  GI:  No nausea, vomiting, diarrhea or  constipation.  GU: No dysuria or hematuria or discharge.  MUSCULOSKELETAL:  Right knee.   PHYSICAL  EXAMINATION:  VITAL SIGNS:  Pulse 72, respirations 14, blood  pressure 112/76.  GENERAL:  A 61-year white female well-nourished, well-developed, no  acute distress.  She is alert and cooperative, pleasant.  Good  historian.  Excellent historian.  HEENT:  Normocephalic, atraumatic.  Pupils round and reactive.  EOMs  intact.  NECK:  Supple.  CHEST:  Clear.  HEART:  Regular rate and rhythm.  No murmur, S1 ST noted.  ABDOMEN:  Soft, nontender.  Bowel sounds present.  BREASTS/RECTUM/GENITALIA:  Not done, not pertinent to present illness.  EXTREMITIES:  Right knee, no effusion.  Range of motion 5-120, marked  crepitus, no instability.   IMPRESSION:  Osteoarthritis right knee.   PLAN:  The patient admitted to [facility] to undergo right  total knee replacement arthroplasty.  Surgery will be performed by Dr.  Ollen Gross.      Alexzandrew L. Perkins, P.A.C.      Ollen Gross, M.D.  Electronically Signed    ALP/MEDQ  D:  08/14/2008  T:  08/15/2008  Job:  [id number]   cc:   Gwen Pounds, MD  Fax: [phone]

## 2011-03-16 ENCOUNTER — Telehealth: Payer: Self-pay

## 2011-03-16 MED ORDER — RIFAXIMIN 550 MG PO TABS: 550.0000 mg | ORAL_TABLET | Freq: Two times a day (BID) | ORAL | Status: AC

## 2011-03-16 NOTE — Telephone Encounter (Signed)
Per Dr Russella Dar patient needs xifaxan 1 po bid for 10 days.  I have called this in her husband will pick it up on his way home.

## 2011-03-22 ENCOUNTER — Other Ambulatory Visit: Payer: Self-pay | Admitting: Pulmonary Disease

## 2011-03-22 DIAGNOSIS — G4733 Obstructive sleep apnea (adult) (pediatric): Secondary | ICD-10-CM

## 2011-03-24 ENCOUNTER — Ambulatory Visit: Payer: 59 | Admitting: Gastroenterology

## 2011-06-15 ENCOUNTER — Other Ambulatory Visit: Payer: Self-pay | Admitting: Obstetrics and Gynecology

## 2011-06-15 DIAGNOSIS — Z1231 Encounter for screening mammogram for malignant neoplasm of breast: Secondary | ICD-10-CM

## 2011-07-18 LAB — COMPREHENSIVE METABOLIC PANEL
ALT: 16
AST: 25
Alkaline Phosphatase: 40
CO2: 27
Chloride: 101
GFR calc Af Amer: >60
GFR calc non Af Amer: >60
Glucose, Bld: 111 — ABNORMAL HIGH
Potassium: 4.1
Sodium: 135
Total Bilirubin: 0.5

## 2011-07-18 LAB — CBC
HCT: 29 — ABNORMAL LOW
HCT: 33.8 — ABNORMAL LOW
Hemoglobin: 10.2 — ABNORMAL LOW
Hemoglobin: 11.6 — ABNORMAL LOW
Hemoglobin: 13.5
Hemoglobin: 9.8 — ABNORMAL LOW
MCHC: 33.2
MCHC: 33.9
MCV: 95.7
MCV: 96.2
Platelets: 167
Platelets: 218
RBC: 4.26
RDW: 13.2
RDW: 13.4
RDW: 13.5
WBC: 5.2

## 2011-07-18 LAB — URINALYSIS, ROUTINE W REFLEX MICROSCOPIC
Ketones, ur: NEGATIVE
Nitrite: NEGATIVE
Protein, ur: NEGATIVE
Urobilinogen, UA: 0.2
pH: 7.5

## 2011-07-18 LAB — BASIC METABOLIC PANEL
BUN: 10
CO2: 27
CO2: 28
Calcium: 8.5
Chloride: 103
Chloride: 105
Creatinine, Ser: 0.78
Glucose, Bld: 115 — ABNORMAL HIGH
Glucose, Bld: 151 — ABNORMAL HIGH
Potassium: 4.1

## 2011-07-18 LAB — APTT: aPTT: 27

## 2011-07-18 LAB — PROTIME-INR
INR: 1
INR: 1.6 — ABNORMAL HIGH
Prothrombin Time: 13.2

## 2011-07-25 ENCOUNTER — Ambulatory Visit
Admission: RE | Admit: 2011-07-25 | Discharge: 2011-07-25 | Disposition: A | Discharge status: Home / Self Care | Payer: 59 | Source: Ambulatory Visit | Attending: Obstetrics and Gynecology | Admitting: Obstetrics and Gynecology

## 2011-07-25 DIAGNOSIS — Z1231 Encounter for screening mammogram for malignant neoplasm of breast: Secondary | ICD-10-CM

## 2011-09-23 ENCOUNTER — Other Ambulatory Visit (INDEPENDENT_AMBULATORY_CARE_PROVIDER_SITE_OTHER): Payer: 59 | Admitting: Gastroenterology

## 2011-09-23 ENCOUNTER — Ambulatory Visit: Payer: 59 | Admitting: Gastroenterology

## 2011-09-23 ENCOUNTER — Encounter: Payer: Self-pay | Admitting: Gastroenterology

## 2011-09-23 DIAGNOSIS — Z23 Encounter for immunization: Secondary | ICD-10-CM

## 2011-10-25 ENCOUNTER — Other Ambulatory Visit: Payer: Self-pay

## 2011-10-25 MED ORDER — ZOLPIDEM TARTRATE 10 MG PO TABS: 10.0000 mg | ORAL_TABLET | Freq: Every evening | ORAL | Status: DC | PRN

## 2011-11-01 ENCOUNTER — Other Ambulatory Visit: Payer: Self-pay

## 2011-11-01 MED ORDER — SCOPOLAMINE 1 MG/3DAYS TD PT72: 1.0000 | MEDICATED_PATCH | TRANSDERMAL | Status: AC

## 2012-01-24 ENCOUNTER — Other Ambulatory Visit: Payer: Self-pay | Admitting: *Deleted

## 2012-01-24 MED ORDER — MINOCYCLINE HCL 100 MG PO CAPS: 100.0000 mg | ORAL_CAPSULE | Freq: Every day | ORAL | Status: AC

## 2012-01-24 NOTE — Telephone Encounter (Signed)
OK  ----- Message ----- From: Leonette Monarch, CMA Sent: 01/24/2012 3:56 PM To: Meryl Dare, MD,FACG  I got a refill request for Minocycline 100mg  capsule take one capsule a day #30 for Angelica Guerrero, Dr Jarold Motto ask me to see if you would refill it since your her GI MD.   Thanks Hulan Saas

## 2012-01-25 ENCOUNTER — Other Ambulatory Visit: Payer: Self-pay

## 2012-01-25 DIAGNOSIS — R197 Diarrhea, unspecified: Secondary | ICD-10-CM

## 2012-01-25 NOTE — Progress Notes (Signed)
Per Dr Russella Dar patient needs CT abd with pancreatic protocol, CMET, fecal elastase, and TSH.  She is scheduled for the CT scan 02/06/12 11:00.  I have notified her husband of the detaills

## 2012-02-06 ENCOUNTER — Ambulatory Visit (INDEPENDENT_AMBULATORY_CARE_PROVIDER_SITE_OTHER)
Admission: RE | Admit: 2012-02-06 | Discharge: 2012-02-06 | Disposition: A | Discharge status: Home / Self Care | Payer: 59 | Source: Ambulatory Visit | Attending: Gastroenterology | Admitting: Gastroenterology

## 2012-02-06 ENCOUNTER — Other Ambulatory Visit: Payer: 59

## 2012-02-06 DIAGNOSIS — R197 Diarrhea, unspecified: Secondary | ICD-10-CM

## 2012-02-06 MED ORDER — IOHEXOL 300 MG/ML  SOLN
100.0000 mL | Freq: Once | INTRAMUSCULAR | Status: AC | PRN
  Administered 2012-02-06: 100 mL via INTRAVENOUS

## 2012-02-15 LAB — PANCREATIC ELASTASE, FECAL: Pancreatic Elastase-1, Stool: >500 mcg/g

## 2012-02-17 ENCOUNTER — Other Ambulatory Visit: Payer: Self-pay | Admitting: Gastroenterology

## 2012-02-17 MED ORDER — ALOSETRON HCL 0.5 MG PO TABS
0.5000 mg | ORAL_TABLET | Freq: Two times a day (BID) | ORAL | Status: DC
Start: 2012-02-17 — End: 2012-12-28

## 2012-07-05 ENCOUNTER — Other Ambulatory Visit: Payer: Self-pay

## 2012-07-05 MED ORDER — ZOLPIDEM TARTRATE ER 6.25 MG PO TBCR: 6.2500 mg | EXTENDED_RELEASE_TABLET | Freq: Every evening | ORAL | Status: DC | PRN

## 2012-08-30 ENCOUNTER — Other Ambulatory Visit: Payer: Self-pay

## 2012-08-30 MED ORDER — ZOLPIDEM TARTRATE ER 6.25 MG PO TBCR: 6.2500 mg | EXTENDED_RELEASE_TABLET | Freq: Every evening | ORAL | Status: DC | PRN

## 2012-10-31 ENCOUNTER — Other Ambulatory Visit: Payer: Self-pay | Admitting: Gynecology

## 2012-10-31 DIAGNOSIS — Z1231 Encounter for screening mammogram for malignant neoplasm of breast: Secondary | ICD-10-CM

## 2012-11-26 ENCOUNTER — Ambulatory Visit
Admission: RE | Admit: 2012-11-26 | Discharge: 2012-11-26 | Disposition: A | Discharge status: Home / Self Care | Payer: 59 | Source: Ambulatory Visit | Attending: Gynecology | Admitting: Gynecology

## 2012-11-26 DIAGNOSIS — Z1231 Encounter for screening mammogram for malignant neoplasm of breast: Secondary | ICD-10-CM

## 2012-12-24 ENCOUNTER — Encounter: Payer: Self-pay | Admitting: Internal Medicine

## 2012-12-24 ENCOUNTER — Encounter: Payer: Self-pay | Admitting: *Deleted

## 2012-12-24 ENCOUNTER — Ambulatory Visit (INDEPENDENT_AMBULATORY_CARE_PROVIDER_SITE_OTHER): Payer: 59 | Admitting: Internal Medicine

## 2012-12-24 ENCOUNTER — Other Ambulatory Visit: Payer: Self-pay | Admitting: *Deleted

## 2012-12-24 VITALS — BP 136/80 | HR 77 | Ht 65.5 in | Wt 158.0 lb

## 2012-12-24 DIAGNOSIS — G453 Amaurosis fugax: Secondary | ICD-10-CM

## 2012-12-24 DIAGNOSIS — I639 Cerebral infarction, unspecified: Secondary | ICD-10-CM

## 2012-12-24 DIAGNOSIS — H3400 Transient retinal artery occlusion, unspecified eye: Secondary | ICD-10-CM

## 2012-12-24 DIAGNOSIS — I6350 Cerebral infarction due to unspecified occlusion or stenosis of unspecified cerebral artery: Secondary | ICD-10-CM

## 2012-12-24 LAB — CBC WITH DIFFERENTIAL/PLATELET
Eosinophils Relative: 2.5 % (ref 0.0–5.0)
Monocytes Relative: 9.9 % (ref 3.0–12.0)
Neutrophils Relative %: 59.5 % (ref 43.0–77.0)
Platelets: 264 10*3/uL (ref 150.0–400.0)
WBC: 6.4 10*3/uL (ref 4.5–10.5)

## 2012-12-24 LAB — PROTIME-INR
INR: 1.1 ratio — ABNORMAL HIGH (ref 0.8–1.0)
Prothrombin Time: 11.1 s (ref 10.2–12.4)

## 2012-12-24 NOTE — Patient Instructions (Addendum)
INSERTION OF LOOP RECORDER SEE ATTACHED  Your physician recommends that you HAVE LAB WORK TODAY

## 2012-12-24 NOTE — Progress Notes (Signed)
HPI Angelica Guerrero is referred today by Dr. Sandria Manly for evaluation of [name]. Her history dates back several years. She is been seen in the past by my partner Dr. Riley Kill. The patient has palpitations. She were cardiac monitor which by her report 5 years ago, which was unrevealing. She has a history of sleep apnea and has undergone sleep study demonstrating apnea and she is now on CPAP. The patient does have a history of migraine headaches with associated aura, which  Has been associated with visual changes in the past. Her migraine headaches however have been quiet for several years. She also has a history of cervical spine changes which have been well documented in resulted in an epidural injection in the past. Several weeks ago, she noted the sudden onset of blindness in her left eye. This lasted approximately 30-40 minutes and resolved. She initially did not seek medical attention. Subsequent evaluation with MRI and MRA have been unremarkable. She has very minimal palpitations and denies syncope. She denies chest pain, shortness of breath, but does have very minimal peripheral edema. She walks regularly without difficulty. Allergies  Allergen Reactions  . Morphine And Related Itching     Current Outpatient Prescriptions  Medication Sig Dispense Refill  . alosetron (LOTRONEX) 0.5 MG tablet Take 1 tablet (0.5 mg total) by mouth 2 (two) times daily.  60 tablet  11  . aspirin 81 MG tablet Take 81 mg by mouth daily.      . celecoxib (CELEBREX) 200 MG capsule Take 100 mg by mouth daily.       Marland Kitchen FLUoxetine (PROZAC) 40 MG capsule Take by mouth daily.        . rosuvastatin (CRESTOR) 40 MG tablet Take 20 mg by mouth daily.        No current facility-administered medications for this visit.     Past Medical History  Diagnosis Date  . Snoring   . Cough   . Aortic valve disorders   . Other and unspecified hyperlipidemia   . Dysphagia, oropharyngeal phase   . GERD (gastroesophageal reflux  disease)   . Diverticulosis of colon (without mention of hemorrhage)   . Osteoarthrosis, unspecified whether generalized or localized, unspecified site   . Degeneration of cervical intervertebral disc   . Migraine, unspecified, without mention of intractable migraine without mention of status migrainosus   . External hemorrhoids   . Obstructive sleep apnea   . Hyperlipemia     ROS:   All systems reviewed and negative except as noted in the HPI.   Past Surgical History  Procedure Laterality Date  . Total abdominal hysterectomy    . Tubal ligation      bilateral  . Total knee arthroplasty  2009    right knee   . Facial cosmetic surgery    . Bladder surgery      Tacking   . Tonsillectomy       Family History  Problem Relation Age of Onset  . Alzheimer's disease Mother   . Hyperlipidemia Mother   . Diabetes Father     type II  . Allergies Father   . Asthma Father   . Factor IX deficiency Brother   . Factor IX deficiency Brother   . Allergies Child   . Allergies Brother   . Asthma Grandchild   . Rheum arthritis Maternal Grandmother   . Colon cancer Neg Hx   . Colon polyps Paternal Uncle      History   Social History  .  Marital Status: Married    Spouse Name: N/A    Number of Children: 3  . 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 Sheryn Bison   Social History Main Topics  . Smoking status: Former Smoker -- 1.00 packs/day for 10 years    Types: Cigarettes    Quit date: 10/17/2000  . Smokeless tobacco: Never Used  . Alcohol Use: Not on file  . Drug Use: Not on file  . Sexually Active: Not on file   Other Topics Concern  . Not on file   Social History Narrative   Pt is a housewife.   Pt lives with her husband Dr. Sheryn Bison     BP 136/80  Pulse 77  Ht 5' 5.5" (1.664 m)  Wt 158 lb (71.668 kg)  BMI 25.88 kg/m2  SpO2 97%  Physical Exam:  Well appearing middle-age woman,NAD HEENT:  Unremarkable Neck:  6 cm JVD, no thyromegally Lungs:  Clear with no wheezes, rales, or rhonchi. HEART:  Regular rate rhythm, no murmurs, no rubs, no clicks Abd:  soft, positive bowel sounds, no organomegally, no rebound, no guarding Ext:  2 plus pulses, no edema, no cyanosis, no clubbing Skin:  No rashes no nodules Neuro:  CN II through XII intact, motor grossly intact  EKG - normal sinus rhythm with normal axis and intervals.  Assess/Plan:

## 2012-12-24 NOTE — Assessment & Plan Note (Signed)
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 An embolic source would be most likely, and with her history of palpitations, and sleep apnea, paroxysmal atrial fibrillation would be the most likely cause. We have no documentation of atrial fibrillation however. At this point I would diagnose her symptoms as amaurosis. I've recommended that she undergo insertion of an implantable loop recorder, as she has worn a cardiac monitor in the past, and the likelihood of catching atrial fibrillation over a 4 week period is small. The implantable loop recorder has now been improved for the diagnosis of cryptogenic stroke. I've discussed the risk and benefits, goals and expectations of this approach, and have discussed these with her family. Pending discussions with Dr. Sandria Manly, we will plan to proceed with implantable loop recorder insertion. I strongly suspect that the mechanism of her transient neurologic deficit is related to emboli, most likely from atrial fibrillation.  Because of the risk associated with anti-coagulation, I would not empirically begin anticoagulation without definitive evidence of atrial arrhythmias.

## 2012-12-25 LAB — BASIC METABOLIC PANEL
BUN: 14 mg/dL (ref 6–23)
CO2: 23 mEq/L (ref 19–32)
Calcium: 9.1 mg/dL (ref 8.4–10.5)
Chloride: 107 mEq/L (ref 96–112)
Creatinine, Ser: 0.9 mg/dL (ref 0.4–1.2)
Glucose, Bld: 91 mg/dL (ref 70–99)

## 2012-12-28 ENCOUNTER — Encounter (HOSPITAL_COMMUNITY): Payer: Self-pay | Admitting: Pharmacy Technician

## 2013-01-09 ENCOUNTER — Other Ambulatory Visit: Payer: Self-pay

## 2013-01-09 ENCOUNTER — Encounter: Payer: Self-pay | Admitting: Internal Medicine

## 2013-01-09 MED ORDER — ZOLPIDEM TARTRATE 10 MG PO TABS
10.0000 mg | ORAL_TABLET | Freq: Every evening | ORAL | Status: AC | PRN
Start: 2013-01-09 — End: 2013-02-08

## 2013-01-10 MED ORDER — CEFAZOLIN SODIUM-DEXTROSE 2-3 GM-% IV SOLR
2.0000 g | INTRAVENOUS | Status: DC
  Filled 2013-01-10 (×2): qty 50

## 2013-01-10 MED ORDER — SODIUM CHLORIDE 0.45 % IV SOLN
INTRAVENOUS | Status: DC
  Administered 2013-01-11: 11:00:00 via INTRAVENOUS

## 2013-01-10 MED ORDER — SODIUM CHLORIDE 0.9 % IV SOLN
250.0000 mL | INTRAVENOUS | Status: DC
Start: 2013-01-10 — End: 2013-01-11

## 2013-01-10 MED ORDER — SODIUM CHLORIDE 0.9 % IR SOLN
80.0000 mg | Status: DC
  Filled 2013-01-10: qty 2

## 2013-01-11 ENCOUNTER — Ambulatory Visit (HOSPITAL_COMMUNITY)
Admission: RE | Admit: 2013-01-11 | Discharge: 2013-01-11 | Disposition: A | Discharge status: Home / Self Care | Payer: 59 | Source: Ambulatory Visit | Attending: Internal Medicine | Admitting: Internal Medicine

## 2013-01-11 ENCOUNTER — Encounter (HOSPITAL_COMMUNITY)
Admission: RE | Disposition: A | Discharge status: Home / Self Care | Payer: Self-pay | Source: Ambulatory Visit | Attending: Internal Medicine

## 2013-01-11 ENCOUNTER — Encounter (HOSPITAL_COMMUNITY): Payer: Self-pay | Admitting: *Deleted

## 2013-01-11 DIAGNOSIS — I639 Cerebral infarction, unspecified: Secondary | ICD-10-CM

## 2013-01-11 DIAGNOSIS — R55 Syncope and collapse: Secondary | ICD-10-CM | POA: Insufficient documentation

## 2013-01-11 DIAGNOSIS — R002 Palpitations: Secondary | ICD-10-CM | POA: Insufficient documentation

## 2013-01-11 DIAGNOSIS — I6350 Cerebral infarction due to unspecified occlusion or stenosis of unspecified cerebral artery: Secondary | ICD-10-CM

## 2013-01-11 HISTORY — PX: LOOP RECORDER IMPLANT: SHX5954

## 2013-01-11 HISTORY — PX: LOOP RECORDER IMPLANT: SHX5477

## 2013-01-11 SURGERY — LOOP RECORDER IMPLANT
Anesthesia: LOCAL | Laterality: Left

## 2013-01-11 MED ORDER — SODIUM CHLORIDE 0.9 % IJ SOLN: 3.0000 mL | INTRAMUSCULAR | Status: DC | PRN

## 2013-01-11 MED ORDER — SODIUM CHLORIDE 0.9 % IJ SOLN: 3.0000 mL | Freq: Two times a day (BID) | INTRAMUSCULAR | Status: DC

## 2013-01-11 MED ORDER — MUPIROCIN 2 % EX OINT: TOPICAL_OINTMENT | Freq: Two times a day (BID) | CUTANEOUS | Status: DC

## 2013-01-11 MED ORDER — MIDAZOLAM HCL 2 MG/2ML IJ SOLN
INTRAMUSCULAR | Status: AC
  Filled 2013-01-11: qty 2

## 2013-01-11 MED ORDER — MUPIROCIN 2 % EX OINT
TOPICAL_OINTMENT | CUTANEOUS | Status: AC
  Filled 2013-01-11: qty 22

## 2013-01-11 MED ORDER — CHLORHEXIDINE GLUCONATE 4 % EX LIQD: 60.0000 mL | Freq: Once | CUTANEOUS | Status: DC

## 2013-01-11 NOTE — Op Note (Signed)
Medtronic implantable loop recorder inserted via the left pectoral region without immediate complication. [id number].

## 2013-01-11 NOTE — Interval H&P Note (Signed)
History and Physical Interval Note:  01/11/2013 11:13 AM  Angelica Guerrero  has presented today for surgery, with the diagnosis of Syncope  The various methods of treatment have been discussed with the patient and family. After consideration of risks, benefits and other options for treatment, the patient has consented to  Procedure(s): LOOP RECORDER IMPLANT (N/A) as a surgical intervention .  The patient's history has been reviewed, patient examined, no change in status, stable for surgery.  I have reviewed the patient's chart and labs.  Questions were answered to the patient's satisfaction.     Lewayne Bunting

## 2013-01-11 NOTE — H&P (View-Only) (Signed)
HPI Angelica Guerrero is referred today by Angelica Guerrero for evaluation of Angelica Guerrero. Her history dates back several years. She is been seen in the past by my partner Angelica Guerrero. The patient has palpitations. She were cardiac monitor which by her report 5 years ago, which was unrevealing. She has a history of sleep apnea and has undergone sleep study demonstrating apnea and she is now on CPAP. The patient does have a history of migraine headaches with associated aura, which  Has been associated with visual changes in the past. Her migraine headaches however have been quiet for several years. She also has a history of cervical spine changes which have been well documented in resulted in an epidural injection in the past. Several weeks ago, she noted the sudden onset of blindness in her left eye. This lasted approximately 30-40 minutes and resolved. She initially did not seek medical attention. Subsequent evaluation with MRI and MRA have been unremarkable. She has very minimal palpitations and denies syncope. She denies chest pain, shortness of breath, but does have very minimal peripheral edema. She walks regularly without difficulty. Allergies  Allergen Reactions  . Morphine And Related Itching     Current Outpatient Prescriptions  Medication Sig Dispense Refill  . alosetron (LOTRONEX) 0.5 MG tablet Take 1 tablet (0.5 mg total) by mouth 2 (two) times daily.  60 tablet  11  . aspirin 81 MG tablet Take 81 mg by mouth daily.      . celecoxib (CELEBREX) 200 MG capsule Take 100 mg by mouth daily.       Marland Kitchen FLUoxetine (PROZAC) 40 MG capsule Take by mouth daily.        . rosuvastatin (CRESTOR) 40 MG tablet Take 20 mg by mouth daily.        No current facility-administered medications for this visit.     Past Medical History  Diagnosis Date  . Snoring   . Cough   . Aortic valve disorders   . Other and unspecified hyperlipidemia   . Dysphagia, oropharyngeal phase   . GERD (gastroesophageal reflux  disease)   . Diverticulosis of colon (without mention of hemorrhage)   . Osteoarthrosis, unspecified whether generalized or localized, unspecified site   . Degeneration of cervical intervertebral disc   . Migraine, unspecified, without mention of intractable migraine without mention of status migrainosus   . External hemorrhoids   . Obstructive sleep apnea   . Hyperlipemia     ROS:   All systems reviewed and negative except as noted in the HPI.   Past Surgical History  Procedure Laterality Date  . Total abdominal hysterectomy    . Tubal ligation      bilateral  . Total knee arthroplasty  2009    right knee   . Facial cosmetic surgery    . Bladder surgery      Tacking   . Tonsillectomy       Family History  Problem Relation Age of Onset  . Alzheimer's disease Mother   . Hyperlipidemia Mother   . Diabetes Father     type II  . Allergies Father   . Asthma Father   . Factor IX deficiency Brother   . Factor IX deficiency Brother   . Allergies Child   . Allergies Brother   . Asthma Grandchild   . Rheum arthritis Maternal Grandmother   . Colon cancer Neg Hx   . Colon polyps Paternal Uncle      History   Social History  .  Marital Status: Married    Spouse Name: N/A    Number of Children: 3  . 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 Smoking status: Former Smoker -- 1.00 packs/day for 10 years    Types: Cigarettes    Quit date: 10/17/2000  . Smokeless tobacco: Never Used  . Alcohol Use: Not on file  . Drug Use: Not on file  . Sexually Active: Not on file   Other Topics Concern  . Not on file   Social History Narrative   Pt is a housewife.   Pt lives with her husband Angelica Guerrero     BP 136/80  Pulse 77  Ht 5' 5.5" (1.664 m)  Wt 158 lb (71.668 kg)  BMI 25.88 kg/m2  SpO2 97%  Physical Exam:  Well appearing middle-age woman,NAD HEENT:  Unremarkable Neck:  6 cm JVD, no thyromegally Lungs:  Clear with no wheezes, rales, or rhonchi. HEART:  Regular rate rhythm, no murmurs, no rubs, no clicks Abd:  soft, positive bowel sounds, no organomegally, no rebound, no guarding Ext:  2 plus pulses, no edema, no cyanosis, no clubbing Skin:  No rashes no nodules Neuro:  CN II through XII intact, motor grossly intact  EKG - normal sinus rhythm with normal axis and intervals.  Assess/Plan:

## 2013-01-12 NOTE — Op Note (Signed)
NAMEMURLINE, WEIGEL            ACCOUNT NO.:  [id number]  MEDICAL RECORD NO.:  [id number]  LOCATION:  [facility]                         FACILITY:  [facility]  PHYSICIAN:  Doylene Canning. Ladona Ridgel, MD    DATE OF BIRTH:  Feb 25, 1946  DATE OF PROCEDURE:  01/11/2013 DATE OF DISCHARGE:  01/11/2013                              OPERATIVE REPORT   PROCEDURE PERFORMED:  Insertion of a Medtronic Reveal LINQ implantable loop recorder.  INTRODUCTION:  The patient is a 67 year old woman with unexplained episode of left eye blindness, thought to be due to a cryptogenic stroke/TIA.  She has undergone cardiac workup, all negative.  She does have palpitations.  The concern is that she is having occult atrial fibrillation, but this has never been documented.  She is now referred for insertion of a Medtronic implantable loop recorder.  DESCRIPTION OF PROCEDURE:  After informed was obtained, the patient was taken to the catheterization lab in the fasting state.  A 30 mL of lidocaine was infiltrated into the left pectoral region after the usual preparation and draping.  A 1.5-cm stab incision was made over the pectoral region approximately 2 cm lateral to the midline and 4 cm below the clavicle.  The Medtronic Reveal LINQ implantable loop recorder, serial number [id number] was inserted through this stab incision into the subcutaneous tissue.  Pressure was held and a Steri-Strip was placed over the incision.  Additional pressure held and the patient returned to her room in satisfactory condition.  COMPLICATIONS:  There were no immediate procedure complications.  RESULTS:  Demonstrate successful insertion of a Medtronic implantable loop recorder without immediate procedure complications.     Doylene Canning. Ladona Ridgel, MD     GWT/MEDQ  D:  01/11/2013  T:  01/12/2013  Job:  [id number]  cc:   Dr. Melbourne Abts Barbaraann Share, MD,FCCP

## 2013-01-14 ENCOUNTER — Telehealth: Payer: Self-pay | Admitting: Internal Medicine

## 2013-01-14 NOTE — Telephone Encounter (Signed)
01-14-13 lmm @ 228pm for pt to call to set up loop recorder, (pacer wound ck), in 14 days from 12-14-12/mt

## 2013-01-18 ENCOUNTER — Telehealth: Payer: Self-pay | Admitting: Internal Medicine

## 2013-01-18 ENCOUNTER — Other Ambulatory Visit: Payer: Self-pay | Admitting: *Deleted

## 2013-01-18 MED ORDER — CEPHALEXIN 500 MG PO CAPS: 500.0000 mg | ORAL_CAPSULE | Freq: Two times a day (BID) | ORAL | Status: DC

## 2013-01-18 NOTE — Telephone Encounter (Signed)
Pt coming at 2 to have site looked at

## 2013-01-18 NOTE — Telephone Encounter (Signed)
New problem   Pt thinks she may have a infected wound, she had cardiac loop in chest. Please call pt concerning this matter.

## 2013-01-18 NOTE — Telephone Encounter (Signed)
Dr Johney Frame looked at her site and is going to start her on Keflex 500mg  bid x 7days

## 2013-01-24 ENCOUNTER — Ambulatory Visit (INDEPENDENT_AMBULATORY_CARE_PROVIDER_SITE_OTHER): Payer: 59 | Admitting: *Deleted

## 2013-01-24 ENCOUNTER — Other Ambulatory Visit: Payer: Self-pay | Admitting: Internal Medicine

## 2013-01-24 DIAGNOSIS — R55 Syncope and collapse: Secondary | ICD-10-CM

## 2013-01-24 LAB — PACEMAKER DEVICE OBSERVATION

## 2013-01-24 NOTE — Progress Notes (Signed)
ILR check in clinic

## 2013-02-13 ENCOUNTER — Other Ambulatory Visit (HOSPITAL_COMMUNITY): Payer: Self-pay | Admitting: Orthopedic Surgery

## 2013-02-13 ENCOUNTER — Telehealth: Payer: Self-pay | Admitting: *Deleted

## 2013-02-13 DIAGNOSIS — M25562 Pain in left knee: Secondary | ICD-10-CM

## 2013-02-13 MED ORDER — ZOLPIDEM TARTRATE 10 MG PO TABS: 10.0000 mg | ORAL_TABLET | Freq: Every evening | ORAL | Status: AC | PRN

## 2013-02-13 NOTE — Telephone Encounter (Signed)
Patient's husband requests a refill on patient's Ambien. Per Dr Russella Dar, patient may have Ambien #[id number] with 2 additional refills. After that, patient should continue getting refills of Ambien from her primary care physician if indicated.

## 2013-02-14 ENCOUNTER — Ambulatory Visit (HOSPITAL_COMMUNITY)
Admission: RE | Admit: 2013-02-14 | Discharge: 2013-02-14 | Disposition: A | Discharge status: Home / Self Care | Payer: 59 | Source: Ambulatory Visit | Attending: Orthopedic Surgery | Admitting: Orthopedic Surgery

## 2013-02-14 DIAGNOSIS — IMO0002 Reserved for concepts with insufficient information to code with codable children: Secondary | ICD-10-CM | POA: Insufficient documentation

## 2013-02-14 DIAGNOSIS — W19XXXA Unspecified fall, initial encounter: Secondary | ICD-10-CM | POA: Insufficient documentation

## 2013-02-14 DIAGNOSIS — M1710 Unilateral primary osteoarthritis, unspecified knee: Secondary | ICD-10-CM | POA: Insufficient documentation

## 2013-02-14 DIAGNOSIS — M25562 Pain in left knee: Secondary | ICD-10-CM

## 2013-02-15 ENCOUNTER — Encounter: Payer: Self-pay | Admitting: Internal Medicine

## 2013-03-06 ENCOUNTER — Other Ambulatory Visit: Payer: Self-pay

## 2013-03-06 MED ORDER — ALOSETRON HCL 0.5 MG PO TABS: 0.2500 mg | ORAL_TABLET | Freq: Every day | ORAL | Status: DC

## 2013-03-07 ENCOUNTER — Other Ambulatory Visit: Payer: Self-pay

## 2013-03-07 MED ORDER — ALOSETRON HCL 0.5 MG PO TABS: 0.5000 mg | ORAL_TABLET | Freq: Two times a day (BID) | ORAL | Status: DC

## 2013-04-04 ENCOUNTER — Encounter: Payer: Self-pay | Admitting: Neurology

## 2013-06-14 ENCOUNTER — Other Ambulatory Visit: Payer: Self-pay

## 2013-06-14 MED ORDER — MINOCYCLINE HCL 100 MG PO CAPS: 100.0000 mg | ORAL_CAPSULE | Freq: Every day | ORAL | Status: DC

## 2013-06-19 ENCOUNTER — Ambulatory Visit: Payer: 59 | Admitting: Gastroenterology

## 2013-07-25 ENCOUNTER — Other Ambulatory Visit: Payer: Self-pay | Admitting: Orthopedic Surgery

## 2013-07-29 ENCOUNTER — Encounter (HOSPITAL_COMMUNITY): Payer: Self-pay | Admitting: Pharmacy Technician

## 2013-07-29 ENCOUNTER — Other Ambulatory Visit: Payer: Self-pay

## 2013-07-29 MED ORDER — ALOSETRON HCL 0.5 MG PO TABS: 0.2500 mg | ORAL_TABLET | Freq: Every morning | ORAL | Status: DC

## 2013-07-29 NOTE — Addendum Note (Signed)
Addended by: Annett Fabian on: 07/29/2013 02:29 PM   Modules accepted: Orders

## 2013-08-05 ENCOUNTER — Encounter (HOSPITAL_COMMUNITY): Payer: Self-pay

## 2013-08-05 ENCOUNTER — Encounter (HOSPITAL_COMMUNITY)
Admission: RE | Admit: 2013-08-05 | Discharge: 2013-08-05 | Disposition: A | Discharge status: Home / Self Care | Payer: 59 | Source: Ambulatory Visit | Attending: Orthopedic Surgery | Admitting: Orthopedic Surgery

## 2013-08-05 ENCOUNTER — Ambulatory Visit (HOSPITAL_COMMUNITY)
Admission: RE | Admit: 2013-08-05 | Discharge: 2013-08-05 | Disposition: A | Discharge status: Home / Self Care | Payer: 59 | Source: Ambulatory Visit | Attending: Orthopedic Surgery | Admitting: Orthopedic Surgery

## 2013-08-05 DIAGNOSIS — Z01818 Encounter for other preprocedural examination: Secondary | ICD-10-CM | POA: Insufficient documentation

## 2013-08-05 DIAGNOSIS — X58XXXA Exposure to other specified factors, initial encounter: Secondary | ICD-10-CM | POA: Insufficient documentation

## 2013-08-05 DIAGNOSIS — IMO0002 Reserved for concepts with insufficient information to code with codable children: Secondary | ICD-10-CM | POA: Insufficient documentation

## 2013-08-05 DIAGNOSIS — Z01812 Encounter for preprocedural laboratory examination: Secondary | ICD-10-CM | POA: Insufficient documentation

## 2013-08-05 HISTORY — DX: Depression, unspecified: F32.A

## 2013-08-05 HISTORY — DX: Major depressive disorder, single episode, unspecified: F32.9

## 2013-08-05 HISTORY — DX: Presence of other cardiac implants and grafts: Z95.818

## 2013-08-05 LAB — BASIC METABOLIC PANEL
BUN: 19 mg/dL (ref 6–23)
Calcium: 9.3 mg/dL (ref 8.4–10.5)
Creatinine, Ser: 0.84 mg/dL (ref 0.50–1.10)
GFR calc Af Amer: 82 mL/min — ABNORMAL LOW (ref >90)
GFR calc non Af Amer: 71 mL/min — ABNORMAL LOW (ref >90)
Glucose, Bld: 105 mg/dL — ABNORMAL HIGH (ref 70–99)
Potassium: 4 mEq/L (ref 3.5–5.1)

## 2013-08-05 LAB — CBC
HCT: 41 % (ref 36.0–46.0)
Hemoglobin: 13.4 g/dL (ref 12.0–15.0)
MCH: 32.4 pg (ref 26.0–34.0)
MCHC: 32.7 g/dL (ref 30.0–36.0)
MCV: 99 fL (ref 78.0–100.0)
RDW: 12.6 % (ref 11.5–15.5)

## 2013-08-05 NOTE — Patient Instructions (Addendum)
20 SYNAI PRETTYMAN  08/05/2013   Your procedure is scheduled on: 08/07/13  Report to [facility] at 7:00 AM.  Call this number if you have problems the morning of surgery 336-: [phone]   Remember:   Do not eat food or drink liquids After Midnight.     Take these medicines the morning of surgery with A SIP OF WATER: wellbutrin, nexium, fluoxetine, crestor   Do not wear jewelry, make-up or nail polish.  Do not wear lotions, powders, or perfumes. You may wear deodorant.  Do not shave 48 hours prior to surgery. Men may shave face and neck.  Do not bring valuables to the hospital.  Contacts, dentures or bridgework may not be worn into surgery.     Patients discharged the day of surgery will not be allowed to drive home.  Name and phone number of your driver: Onalee Hua [phone]   Please read over the following fact sheets that you were given: incentive spirometry fact sheet Birdie Sons, RN  pre op nurse call if needed [phone]    FAILURE TO FOLLOW THESE INSTRUCTIONS MAY RESULT IN CANCELLATION OF YOUR SURGERY   Patient Signature: ___________________________________________

## 2013-08-05 NOTE — Progress Notes (Signed)
Perioperative prescription for implanted cardiac device on chart, EKG 12/24/12 on EPIC

## 2013-08-06 ENCOUNTER — Encounter: Payer: Self-pay | Admitting: Internal Medicine

## 2013-08-06 ENCOUNTER — Telehealth: Payer: Self-pay | Admitting: Internal Medicine

## 2013-08-06 NOTE — H&P (Signed)
  CC- Angelica Guerrero is a 67 y.o. female who presents with left knee pain.  HPI- . Knee Pain: Patient presents with knee pain involving the  left knee. Onset of the symptoms was several months ago. Inciting event: none known. Current symptoms include pain located medially, stiffness and swelling. Pain is aggravated by pivoting, rising after sitting and squatting.  Patient has had no prior knee problems. Evaluation to date: MRI: abnormal medial meniscal tear. Treatment to date: corticosteroid injection which was not very effective.  Past Medical History  Diagnosis Date  . Snoring   . Cough   . Aortic valve disorders     "maybe prolapse"  . Other and unspecified hyperlipidemia   . Dysphagia, oropharyngeal phase   . GERD (gastroesophageal reflux disease)   . Obstructive sleep apnea   . Hyperlipemia   . Depression   . Status post placement of implantable loop recorder   . External hemorrhoids 1969  . Migraine, unspecified, without mention of intractable migraine without mention of status migrainosus   . Osteoarthrosis, unspecified whether generalized or localized, unspecified site     Past Surgical History  Procedure Laterality Date  . Total abdominal hysterectomy    . Tubal ligation      bilateral  . Total knee arthroplasty  2009    right knee   . Facial cosmetic surgery    . Bladder surgery      Tacking with hysterectomy  . Tonsillectomy    . Loop recorder implant  01-11-2013    Medtronic LinQ implanted by Dr Ladona Ridgel for cryptogenic stroke    Prior to Admission medications   Medication Sig Start Date End Date Taking? Authorizing Provider  alosetron (LOTRONEX) 0.5 MG tablet Take 0.5 tablets (0.25 mg total) by mouth every morning. 07/29/13   Meryl Dare, MD  aspirin EC 81 MG tablet Take 81 mg by mouth every morning.     Historical Provider, MD  buPROPion (WELLBUTRIN XL) 150 MG 24 hr tablet Take 150 mg by mouth every morning.    Historical Provider, MD  celecoxib (CELEBREX)  200 MG capsule Take 200 mg by mouth every morning.     Historical Provider, MD  esomeprazole (NEXIUM) 40 MG capsule Take 40 mg by mouth daily before breakfast.    Historical Provider, MD  FLUoxetine (PROZAC) 40 MG capsule Take 40 mg by mouth every morning.     Historical Provider, MD  rosuvastatin (CRESTOR) 40 MG tablet Take 20 mg by mouth every morning.     Historical Provider, MD   KNEE EXAM antalgic gait, soft tissue tenderness over medial joint line, no effusion, negative pivot-shift, collateral ligaments intact  Physical Examination: General appearance - alert, well appearing, and in no distress Mental status - alert, oriented to person, place, and time Chest - clear to auscultation, no wheezes, rales or rhonchi, symmetric air entry Heart - normal rate, regular rhythm, normal S1, S2, no murmurs, rubs, clicks or gallops Abdomen - soft, nontender, nondistended, no masses or organomegaly Neurological - alert, oriented, normal speech, no focal findings or movement disorder noted   Asessment/Plan--- Left knee medial meniscal tear- - Plan left knee arthroscopy with meniscal debridement. Procedure risks and potential comps discussed with patient who elects to proceed. Goals are decreased pain and increased function with a high likelihood of achieving both

## 2013-08-06 NOTE — Telephone Encounter (Signed)
08-06-13 pt was due to see taylor July 2014, sent past due letter/mt

## 2013-08-07 ENCOUNTER — Encounter (HOSPITAL_COMMUNITY): Payer: Self-pay

## 2013-08-07 ENCOUNTER — Ambulatory Visit (HOSPITAL_COMMUNITY): Payer: 59 | Admitting: Anesthesiology

## 2013-08-07 ENCOUNTER — Encounter (HOSPITAL_COMMUNITY): Payer: 59 | Admitting: Anesthesiology

## 2013-08-07 ENCOUNTER — Ambulatory Visit (HOSPITAL_COMMUNITY)
Admission: RE | Admit: 2013-08-07 | Discharge: 2013-08-07 | Disposition: A | Discharge status: Home / Self Care | Payer: 59 | Source: Ambulatory Visit | Attending: Orthopedic Surgery | Admitting: Orthopedic Surgery

## 2013-08-07 ENCOUNTER — Encounter (HOSPITAL_COMMUNITY)
Admission: RE | Disposition: A | Discharge status: Home / Self Care | Payer: Self-pay | Source: Ambulatory Visit | Attending: Orthopedic Surgery

## 2013-08-07 DIAGNOSIS — K644 Residual hemorrhoidal skin tags: Secondary | ICD-10-CM | POA: Insufficient documentation

## 2013-08-07 DIAGNOSIS — K219 Gastro-esophageal reflux disease without esophagitis: Secondary | ICD-10-CM | POA: Insufficient documentation

## 2013-08-07 DIAGNOSIS — I359 Nonrheumatic aortic valve disorder, unspecified: Secondary | ICD-10-CM | POA: Insufficient documentation

## 2013-08-07 DIAGNOSIS — M2240 Chondromalacia patellae, unspecified knee: Secondary | ICD-10-CM | POA: Insufficient documentation

## 2013-08-07 DIAGNOSIS — M1990 Unspecified osteoarthritis, unspecified site: Secondary | ICD-10-CM | POA: Insufficient documentation

## 2013-08-07 DIAGNOSIS — E7800 Pure hypercholesterolemia, unspecified: Secondary | ICD-10-CM | POA: Insufficient documentation

## 2013-08-07 DIAGNOSIS — S83242A Other tear of medial meniscus, current injury, left knee, initial encounter: Secondary | ICD-10-CM

## 2013-08-07 DIAGNOSIS — Z79899 Other long term (current) drug therapy: Secondary | ICD-10-CM | POA: Insufficient documentation

## 2013-08-07 DIAGNOSIS — G43909 Migraine, unspecified, not intractable, without status migrainosus: Secondary | ICD-10-CM | POA: Insufficient documentation

## 2013-08-07 DIAGNOSIS — S83249A Other tear of medial meniscus, current injury, unspecified knee, initial encounter: Secondary | ICD-10-CM

## 2013-08-07 DIAGNOSIS — Z7982 Long term (current) use of aspirin: Secondary | ICD-10-CM | POA: Insufficient documentation

## 2013-08-07 HISTORY — PX: KNEE ARTHROSCOPY: SHX127

## 2013-08-07 SURGERY — ARTHROSCOPY, KNEE
Anesthesia: General | Site: Knee | Laterality: Left | Wound class: Clean

## 2013-08-07 MED ORDER — MIDAZOLAM HCL 5 MG/5ML IJ SOLN
INTRAMUSCULAR | Status: DC | PRN
  Administered 2013-08-07: 2 mg via INTRAVENOUS

## 2013-08-07 MED ORDER — BUPIVACAINE-EPINEPHRINE 0.25% -1:200000 IJ SOLN
INTRAMUSCULAR | Status: DC | PRN
  Administered 2013-08-07: 20 mL

## 2013-08-07 MED ORDER — HYDROCODONE-ACETAMINOPHEN 5-325 MG PO TABS: 1.0000 | ORAL_TABLET | Freq: Four times a day (QID) | ORAL | Status: DC | PRN

## 2013-08-07 MED ORDER — FENTANYL CITRATE 0.05 MG/ML IJ SOLN
INTRAMUSCULAR | Status: AC
  Filled 2013-08-07: qty 2

## 2013-08-07 MED ORDER — METHOCARBAMOL 500 MG PO TABS: 500.0000 mg | ORAL_TABLET | Freq: Four times a day (QID) | ORAL | Status: DC

## 2013-08-07 MED ORDER — ACETAMINOPHEN 10 MG/ML IV SOLN
1000.0000 mg | Freq: Once | INTRAVENOUS | Status: AC
  Administered 2013-08-07: 1000 mg via INTRAVENOUS
  Filled 2013-08-07: qty 100

## 2013-08-07 MED ORDER — BUPIVACAINE-EPINEPHRINE PF 0.25-1:200000 % IJ SOLN
INTRAMUSCULAR | Status: AC
  Filled 2013-08-07: qty 30

## 2013-08-07 MED ORDER — LIDOCAINE HCL (CARDIAC) 20 MG/ML IV SOLN
INTRAVENOUS | Status: DC | PRN
  Administered 2013-08-07: 80 mg via INTRAVENOUS

## 2013-08-07 MED ORDER — LACTATED RINGERS IV SOLN
INTRAVENOUS | Status: DC
Start: 2013-08-07 — End: 2013-08-07

## 2013-08-07 MED ORDER — EPHEDRINE SULFATE 50 MG/ML IJ SOLN
INTRAMUSCULAR | Status: DC | PRN
  Administered 2013-08-07: 5 mg via INTRAVENOUS

## 2013-08-07 MED ORDER — SODIUM CHLORIDE 0.9 % IR SOLN
Status: DC | PRN
  Administered 2013-08-07 (×3): 3000 mL

## 2013-08-07 MED ORDER — DEXAMETHASONE SODIUM PHOSPHATE 10 MG/ML IJ SOLN: 10.0000 mg | Freq: Once | INTRAMUSCULAR | Status: DC

## 2013-08-07 MED ORDER — SODIUM CHLORIDE 0.9 % IV SOLN: INTRAVENOUS | Status: DC

## 2013-08-07 MED ORDER — FENTANYL CITRATE 0.05 MG/ML IJ SOLN
INTRAMUSCULAR | Status: DC | PRN
  Administered 2013-08-07 (×2): 25 ug via INTRAVENOUS
  Administered 2013-08-07 (×2): 50 ug via INTRAVENOUS

## 2013-08-07 MED ORDER — CEFAZOLIN SODIUM-DEXTROSE 2-3 GM-% IV SOLR
2.0000 g | INTRAVENOUS | Status: AC
  Administered 2013-08-07: 2 g via INTRAVENOUS

## 2013-08-07 MED ORDER — PROPOFOL 10 MG/ML IV BOLUS
INTRAVENOUS | Status: DC | PRN
  Administered 2013-08-07: 180 mg via INTRAVENOUS
  Administered 2013-08-07: 20 mg via INTRAVENOUS

## 2013-08-07 MED ORDER — DEXAMETHASONE SODIUM PHOSPHATE 10 MG/ML IJ SOLN
INTRAMUSCULAR | Status: DC | PRN
  Administered 2013-08-07: 10 mg via INTRAVENOUS

## 2013-08-07 MED ORDER — ONDANSETRON HCL 4 MG/2ML IJ SOLN
INTRAMUSCULAR | Status: DC | PRN
  Administered 2013-08-07: 4 mg via INTRAMUSCULAR

## 2013-08-07 MED ORDER — CEFAZOLIN SODIUM-DEXTROSE 2-3 GM-% IV SOLR
INTRAVENOUS | Status: AC
  Filled 2013-08-07: qty 50

## 2013-08-07 MED ORDER — FENTANYL CITRATE 0.05 MG/ML IJ SOLN
25.0000 ug | INTRAMUSCULAR | Status: DC | PRN
  Administered 2013-08-07: 50 ug via INTRAVENOUS

## 2013-08-07 MED ORDER — LACTATED RINGERS IV SOLN
INTRAVENOUS | Status: DC | PRN
  Administered 2013-08-07: 08:00:00 via INTRAVENOUS

## 2013-08-07 SURGICAL SUPPLY — 27 items
BANDAGE ELASTIC 6 VELCRO ST LF (GAUZE/BANDAGES/DRESSINGS) ×1 IMPLANT
BLADE 4.2CUDA (BLADE) ×2 IMPLANT
CLOTH BEACON ORANGE TIMEOUT ST (SAFETY) ×2 IMPLANT
COUNTER NEEDLE 20 DBL MAG RED (NEEDLE) ×2 IMPLANT
CUFF TOURN SGL QUICK 34 (TOURNIQUET CUFF) ×2
CUFF TRNQT CYL 34X4X40X1 (TOURNIQUET CUFF) ×1 IMPLANT
DRAPE U-SHAPE 47X51 STRL (DRAPES) ×2 IMPLANT
DRSG EMULSION OIL 3X3 NADH (GAUZE/BANDAGES/DRESSINGS) ×2 IMPLANT
DURAPREP 26ML APPLICATOR (WOUND CARE) ×2 IMPLANT
GLOVE BIO SURGEON STRL SZ8 (GLOVE) ×2 IMPLANT
GLOVE BIOGEL PI IND STRL 8 (GLOVE) ×2 IMPLANT
GLOVE BIOGEL PI INDICATOR 8 (GLOVE) ×2
GOWN PREVENTION PLUS LG XLONG (DISPOSABLE) ×2 IMPLANT
IV NS IRRIG 3000ML ARTHROMATIC (IV SOLUTION) ×3 IMPLANT
MANIFOLD NEPTUNE II (INSTRUMENTS) ×3 IMPLANT
PACK ARTHROSCOPY WL (CUSTOM PROCEDURE TRAY) ×2 IMPLANT
PACK ICE MAXI GEL EZY WRAP (MISCELLANEOUS) ×6 IMPLANT
PADDING CAST COTTON 6X4 STRL (CAST SUPPLIES) ×3 IMPLANT
POSITIONER SURGICAL ARM (MISCELLANEOUS) ×2 IMPLANT
SET ARTHROSCOPY TUBING (MISCELLANEOUS) ×2
SET ARTHROSCOPY TUBING LN (MISCELLANEOUS) ×1 IMPLANT
SPONGE GAUZE 4X4 12PLY (GAUZE/BANDAGES/DRESSINGS) ×1 IMPLANT
SUT ETHILON 4 0 PS 2 18 (SUTURE) ×2 IMPLANT
SYR 20CC LL (SYRINGE) ×2 IMPLANT
TOWEL OR 17X26 10 PK STRL BLUE (TOWEL DISPOSABLE) ×2 IMPLANT
WAND 90 DEG TURBOVAC W/CORD (SURGICAL WAND) ×2 IMPLANT
WRAP KNEE MAXI GEL POST OP (GAUZE/BANDAGES/DRESSINGS) ×3 IMPLANT

## 2013-08-07 NOTE — Op Note (Signed)
Preoperative diagnosis-  Left knee medial meniscal tear  Postoperative diagnosis Left- knee medial meniscal tear   Procedure- Left knee arthroscopy with medial meniscal debridement    Surgeon- Gus Rankin. Alyia Lacerte, MD  Anesthesia-General  EBL-  Minimal  Complications- None  Condition- PACU - hemodynamically stable.  Brief clinical note- -Angelica Guerrero is a 67 y.o.  female with a several month history of left knee pain and mechanical symptoms. Exam and history suggested medial meniscal tear confirmed by MRI. The patient presents now for arthroscopy and debridement   Procedure in detail -       After successful administration of General anesthetic, a tourmiquet is placed high on the Left  thigh and the Left lower extremity is prepped and draped in the usual sterile fashion. Time out is performed by the surgical team. Standard superomedial and inferolateral portal sites are marked and incisions made with an 11 blade. The inflow cannula is passed through the superomedial portal and camera through the inferolateral portal and inflow is initiated. Arthroscopic visualization proceeds.      The undersurface of the patella and trochlea are visualized and there are grade IV changes with exposed bone on the undersurface of the patella and lateral trochlea. The medial and lateral gutters are visualized and there are  no loose bodies. Flexion and valgus force is applied to the knee and the medial compartment is entered. A spinal needle is passed into the joint through the site marked for the inferomedial portal. A small incision is made and the dilator passed into the joint. 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 The tear is debrided to a stable base with baskets and a shaver and sealed off with the Arthrocare.     The  intercondylar notch is visualized and the ACL appears normal. The lateral compartment is entered and the findings are normal .      The joint is again inspected and there are no other tears, defects or loose bodies identified. The arthroscopic equipment is then removed from the inferior portals which are closed with interrupted 4-0 nylon. 20 ml of .25% Marcaine with epinephrine are injected through the inflow cannula and the cannula is then removed and the portal closed with nylon. The incisions are cleaned and dried and a bulky sterile dressing is applied. The patient is then awakened and transported to recovery in stable condition.   08/07/2013, 9:48 AM

## 2013-08-07 NOTE — Anesthesia Postprocedure Evaluation (Signed)
  Anesthesia Post-op Note  Patient: Angelica Guerrero  Procedure(s) Performed: Procedure(s) (LRB): LEFT ARTHROSCOPY KNEE WITH DEBRIDEMENT (Left)  Patient Location: PACU  Anesthesia Type: General  Level of Consciousness: awake and alert   Airway and Oxygen Therapy: Patient Spontanous Breathing  Post-op Pain: mild  Post-op Assessment: Post-op Vital signs reviewed, Patient's Cardiovascular Status Stable, Respiratory Function Stable, Patent Airway and No signs of Nausea or vomiting  Last Vitals:  Filed Vitals:   08/07/13 1115  BP: 143/73  Pulse: 76  Temp: 37 C  Resp: 17    Post-op Vital Signs: stable   Complications: No apparent anesthesia complications

## 2013-08-07 NOTE — Anesthesia Preprocedure Evaluation (Addendum)
Anesthesia Evaluation  Patient identified by MRN, date of birth, ID band Patient awake    Reviewed: Allergy & Precautions, H&P , NPO status , Patient's Chart, lab work & pertinent test results  Airway Mallampati: II TM Distance: >3 FB Neck ROM: full    Dental no notable dental hx. (+) Teeth Intact and Dental Advisory Given   Pulmonary shortness of breath and with exertion, sleep apnea and Continuous Positive Airway Pressure Ventilation ,  breath sounds clear to auscultation  Pulmonary exam normal       Cardiovascular Exercise Tolerance: Good + dysrhythmias Rhythm:regular Rate:Normal  Implantable loop recorder.  AI with prolapse   Neuro/Psych negative neurological ROS  negative psych ROS   GI/Hepatic negative GI ROS, Neg liver ROS, GERD-  Medicated and Controlled,dysphagia   Endo/Other  negative endocrine ROS  Renal/GU negative Renal ROS  negative genitourinary   Musculoskeletal   Abdominal   Peds  Hematology negative hematology ROS (+)   Anesthesia Other Findings   Reproductive/Obstetrics negative OB ROS                           Anesthesia Physical Anesthesia Plan  ASA: III  Anesthesia Plan: General   Post-op Pain Management:    Induction: Intravenous  Airway Management Planned: LMA  Additional Equipment:   Intra-op Plan:   Post-operative Plan:   Informed Consent: I have reviewed the patients History and Physical, chart, labs and discussed the procedure including the risks, benefits and alternatives for the proposed anesthesia with the patient or authorized representative who has indicated his/her understanding and acceptance.   Dental Advisory Given  Plan Discussed with: CRNA and Surgeon  Anesthesia Plan Comments:         Anesthesia Quick Evaluation

## 2013-08-07 NOTE — Transfer of Care (Signed)
Immediate Anesthesia Transfer of Care Note  Patient: Angelica Guerrero  Procedure(s) Performed: Procedure(s): LEFT ARTHROSCOPY KNEE WITH DEBRIDEMENT (Left)  Patient Location: PACU  Anesthesia Type:General  Level of Consciousness: awake, alert , oriented and patient cooperative  Airway & Oxygen Therapy: Patient Spontanous Breathing and Patient connected to face mask oxygen  Post-op Assessment: Report given to PACU RN, Post -op Vital signs reviewed and stable and Patient moving all extremities  Post vital signs: Reviewed and stable  Complications: No apparent anesthesia complications

## 2013-08-07 NOTE — OR Nursing (Signed)
Dressings added to patient chart. 1032 08/07/2013

## 2013-08-07 NOTE — Preoperative (Signed)
Beta Blockers   Reason not to administer Beta Blockers:Not Applicable 

## 2013-08-07 NOTE — Progress Notes (Signed)
Patient has been treated for acute bronchitis and finished Ceftin this am, Has been afebrile w nonproductive cough

## 2013-08-07 NOTE — Interval H&P Note (Signed)
History and Physical Interval Note:  08/07/2013 8:58 AM  Angelica Guerrero  has presented today for surgery, with the diagnosis of LEFT KNEE MEDIAL MENISCUS TEAR  The various methods of treatment have been discussed with the patient and family. After consideration of risks, benefits and other options for treatment, the patient has consented to  Procedure(s): LEFT ARTHROSCOPY KNEE WITH DEBRIDEMENT (Left) as a surgical intervention .  The patient's history has been reviewed, patient examined, no change in status, stable for surgery.  I have reviewed the patient's chart and labs.  Questions were answered to the patient's satisfaction.     Loanne Drilling

## 2013-08-08 ENCOUNTER — Encounter (HOSPITAL_COMMUNITY): Payer: Self-pay | Admitting: Orthopedic Surgery

## 2013-08-21 ENCOUNTER — Ambulatory Visit (INDEPENDENT_AMBULATORY_CARE_PROVIDER_SITE_OTHER): Payer: 59 | Admitting: *Deleted

## 2013-08-21 DIAGNOSIS — I639 Cerebral infarction, unspecified: Secondary | ICD-10-CM

## 2013-08-21 DIAGNOSIS — I6350 Cerebral infarction due to unspecified occlusion or stenosis of unspecified cerebral artery: Secondary | ICD-10-CM

## 2013-09-06 LAB — MDC_IDC_ENUM_SESS_TYPE_REMOTE

## 2013-09-23 ENCOUNTER — Ambulatory Visit (INDEPENDENT_AMBULATORY_CARE_PROVIDER_SITE_OTHER): Payer: 59 | Admitting: *Deleted

## 2013-09-23 DIAGNOSIS — I6350 Cerebral infarction due to unspecified occlusion or stenosis of unspecified cerebral artery: Secondary | ICD-10-CM

## 2013-09-24 ENCOUNTER — Other Ambulatory Visit: Payer: Self-pay

## 2013-09-24 MED ORDER — ZOLPIDEM TARTRATE 10 MG PO TABS: 10.0000 mg | ORAL_TABLET | Freq: Every evening | ORAL | Status: AC | PRN

## 2013-09-30 ENCOUNTER — Encounter: Payer: Self-pay | Admitting: Internal Medicine

## 2013-10-23 ENCOUNTER — Ambulatory Visit (INDEPENDENT_AMBULATORY_CARE_PROVIDER_SITE_OTHER): Payer: 59 | Admitting: *Deleted

## 2013-10-23 DIAGNOSIS — I6350 Cerebral infarction due to unspecified occlusion or stenosis of unspecified cerebral artery: Secondary | ICD-10-CM

## 2013-11-12 ENCOUNTER — Encounter: Payer: 59 | Admitting: Internal Medicine

## 2013-11-13 ENCOUNTER — Other Ambulatory Visit: Payer: Self-pay

## 2013-11-13 MED ORDER — ZOLPIDEM TARTRATE 10 MG PO TABS: 10.0000 mg | ORAL_TABLET | Freq: Every evening | ORAL | Status: DC | PRN

## 2013-11-20 ENCOUNTER — Encounter: Payer: Self-pay | Admitting: Internal Medicine

## 2013-11-21 ENCOUNTER — Ambulatory Visit (INDEPENDENT_AMBULATORY_CARE_PROVIDER_SITE_OTHER): Payer: 59 | Admitting: *Deleted

## 2013-11-21 DIAGNOSIS — R55 Syncope and collapse: Secondary | ICD-10-CM

## 2013-12-05 ENCOUNTER — Ambulatory Visit (INDEPENDENT_AMBULATORY_CARE_PROVIDER_SITE_OTHER): Payer: 59 | Admitting: Internal Medicine

## 2013-12-05 ENCOUNTER — Encounter: Payer: Self-pay | Admitting: Internal Medicine

## 2013-12-05 VITALS — HR 82 | Ht 65.5 in | Wt 153.0 lb

## 2013-12-05 DIAGNOSIS — H3400 Transient retinal artery occlusion, unspecified eye: Secondary | ICD-10-CM

## 2013-12-05 DIAGNOSIS — G453 Amaurosis fugax: Secondary | ICD-10-CM

## 2013-12-05 LAB — MDC_IDC_ENUM_SESS_TYPE_INCLINIC

## 2013-12-05 NOTE — Progress Notes (Signed)
HPI [name] returns today for followup. 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 She underwent insertion of an ILR. Since then she has been stable with no chest pain, sob, or palpitations. She remains active. Allergies  Allergen Reactions  . Lactose Intolerance (Gi) Diarrhea  . Morphine And Related Itching     Current Outpatient Prescriptions  Medication Sig Dispense Refill  . alosetron (LOTRONEX) 0.5 MG tablet Take 0.5 tablets (0.25 mg total) by mouth every morning.  60 tablet  6  . aspirin EC 81 MG tablet Take 81 mg by mouth every morning.       Marland Kitchen buPROPion (WELLBUTRIN XL) 150 MG 24 hr tablet Take 150 mg by mouth every morning.      . celecoxib (CELEBREX) 200 MG capsule Take 200 mg by mouth every morning.       Marland Kitchen esomeprazole (NEXIUM) 40 MG capsule Take 40 mg by mouth daily before breakfast.      . FLUoxetine (PROZAC) 40 MG capsule Take 40 mg by mouth every morning.       . rosuvastatin (CRESTOR) 40 MG tablet Take 20 mg by mouth every morning.        No current facility-administered medications for this visit.     Past Medical History  Diagnosis Date  . Snoring   . Cough   . Aortic valve disorders     "maybe prolapse"  . Other and unspecified hyperlipidemia   . Dysphagia, oropharyngeal phase   . GERD (gastroesophageal reflux disease)   . Obstructive sleep apnea   . Hyperlipemia   . Depression   . Status post placement of implantable loop recorder   . External hemorrhoids 1969  . Migraine, unspecified, without mention of intractable migraine without mention of status migrainosus   . Osteoarthrosis, unspecified whether generalized or localized, unspecified site     ROS:   All systems reviewed and negative except as noted in the HPI.   Past Surgical History  Procedure Laterality Date  . Total abdominal hysterectomy    . Tubal ligation      bilateral  . Total knee arthroplasty  2009      right knee   . Facial cosmetic surgery    . Bladder surgery      Tacking with hysterectomy  . Tonsillectomy    . 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 Knee arthroscopy Left 08/07/2013    Procedure: LEFT ARTHROSCOPY KNEE WITH DEBRIDEMENT;  Surgeon: [name], MD;  Location: [facility];  Service: Orthopedics;  Laterality: Left;     Family History  Problem Relation Age of Onset  . Alzheimer's disease Mother   . Hyperlipidemia Mother   . Diabetes Father     type II  . Allergies Father   . Asthma Father   . Factor IX deficiency Brother   . Factor IX deficiency Brother   . Allergies Child   . Allergies Brother   . Asthma Grandchild   . Rheum arthritis Maternal Grandmother   . Colon cancer Neg Hx   . Colon polyps Paternal Uncle      History   Social History  . Marital Status: Married    Spouse Name: N/A    Number of Children: 3  . Years of Education: N/A   Occupational History  . 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 Smoking status: Former Smoker -- 1.00 packs/day for 10 years    Types: Cigarettes    Quit date: 10/17/2000  . Smokeless tobacco: Never Used  . Alcohol Use: Yes     Comment: glass of wine sometimes  . Drug Use: No  . Sexual Activity: Not on file   Other Topics Concern  . Not on file   Social History Narrative   Pt is a housewife.   Pt lives with her husband Dr. Verl Blalock     Pulse 82  Ht 5' 5.5" (1.664 m)  Wt 153 lb (69.4 kg)  BMI 25.06 kg/m2  Physical Exam:  Well appearing 68 yo woman, NAD HEENT: Unremarkable Neck:  No JVD, no thyromegally Back:  No CVA tenderness Lungs:  Clear with no wheezes HEART:  Regular rate rhythm, no murmurs, no rubs, no clicks Abd:  soft, positive bowel sounds, no organomegally, no rebound, no guarding Ext:  2 plus pulses, no edema, no cyanosis, no clubbing Skin:  No rashes no nodules Neuro:  CN II  through XII intact, motor grossly intact  DEVICE  Normal device function.  See PaceArt for details.   Assess/Plan:

## 2013-12-05 NOTE — Assessment & Plan Note (Signed)
She has had no recurrent symptoms. I have interogated her ILR today and she has had no atrial fibrillation.

## 2013-12-10 LAB — MDC_IDC_ENUM_SESS_TYPE_REMOTE

## 2013-12-11 LAB — MDC_IDC_ENUM_SESS_TYPE_REMOTE

## 2013-12-12 LAB — MDC_IDC_ENUM_SESS_TYPE_REMOTE

## 2013-12-18 ENCOUNTER — Ambulatory Visit (INDEPENDENT_AMBULATORY_CARE_PROVIDER_SITE_OTHER): Payer: Medicare Other | Admitting: Neurology

## 2013-12-18 ENCOUNTER — Encounter: Payer: Self-pay | Admitting: Neurology

## 2013-12-18 VITALS — BP 130/78 | HR 84 | Temp 98.4°F | Ht 66.14 in | Wt 154.2 lb

## 2013-12-18 DIAGNOSIS — G3184 Mild cognitive impairment, so stated: Secondary | ICD-10-CM | POA: Diagnosis not present

## 2013-12-18 NOTE — Patient Instructions (Addendum)
1.  We will iniate referral to [facility] 2.  Check TSH and vitamin B12 3.  Encouraged to keep active lifestyle, regular sleeping habits, and continue to engage in mentally stimulating activities are you already are 4. I will see you back in 1-year or sooner as needed
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
## 2013-12-19 ENCOUNTER — Ambulatory Visit (INDEPENDENT_AMBULATORY_CARE_PROVIDER_SITE_OTHER): Payer: Medicare Other | Admitting: *Deleted

## 2013-12-19 ENCOUNTER — Telehealth: Payer: Self-pay | Admitting: Neurology

## 2013-12-19 DIAGNOSIS — R55 Syncope and collapse: Secondary | ICD-10-CM

## 2013-12-19 LAB — VITAMIN B12: Vitamin B-12: 303 pg/mL (ref 211–911)

## 2013-12-19 LAB — TSH: TSH: 1.247 u[IU]/mL (ref 0.350–4.500)

## 2013-12-19 NOTE — Telephone Encounter (Signed)
Called patient with results of TSH and vitamin B12.  Vitamin B12 levels are low-normal (303) so I recommended oral supplementation of 1079mcg daily.    Asif Muchow K. Posey Pronto, DO

## 2013-12-20 ENCOUNTER — Telehealth: Payer: Self-pay | Admitting: *Deleted

## 2013-12-20 NOTE — Telephone Encounter (Signed)
Called pt and informed her that I scheduled her appt at [facility] for November 2 at 11:30.  Appt is with Dr. Wallene Dales.  They will be mailing her a package with any further instructions.  Faxed note and labs.

## 2013-12-23 ENCOUNTER — Encounter: Payer: Self-pay | Admitting: Internal Medicine

## 2013-12-23 NOTE — Telephone Encounter (Signed)
Noted.  Donika K. Patel, DO   

## 2014-01-06 DIAGNOSIS — F411 Generalized anxiety disorder: Secondary | ICD-10-CM | POA: Diagnosis not present

## 2014-01-06 DIAGNOSIS — E785 Hyperlipidemia, unspecified: Secondary | ICD-10-CM | POA: Diagnosis not present

## 2014-01-06 DIAGNOSIS — Z79899 Other long term (current) drug therapy: Secondary | ICD-10-CM | POA: Diagnosis not present

## 2014-01-06 DIAGNOSIS — H3400 Transient retinal artery occlusion, unspecified eye: Secondary | ICD-10-CM | POA: Diagnosis not present

## 2014-01-06 DIAGNOSIS — R7309 Other abnormal glucose: Secondary | ICD-10-CM | POA: Diagnosis not present

## 2014-01-06 DIAGNOSIS — R002 Palpitations: Secondary | ICD-10-CM | POA: Diagnosis not present

## 2014-01-06 DIAGNOSIS — R82998 Other abnormal findings in urine: Secondary | ICD-10-CM | POA: Diagnosis not present

## 2014-01-09 ENCOUNTER — Telehealth: Payer: Self-pay

## 2014-01-09 NOTE — Telephone Encounter (Signed)
Patient wants refill of Ambien 10 mg at bedtime sent into [facility]. Please advise if ok for refill.

## 2014-01-09 NOTE — Telephone Encounter (Signed)
OK for 1 refill.

## 2014-01-10 MED ORDER — ZOLPIDEM TARTRATE 10 MG PO TABS: 10.0000 mg | ORAL_TABLET | Freq: Every evening | ORAL | Status: AC | PRN

## 2014-01-13 DIAGNOSIS — Z1331 Encounter for screening for depression: Secondary | ICD-10-CM | POA: Diagnosis not present

## 2014-01-13 DIAGNOSIS — E739 Lactose intolerance, unspecified: Secondary | ICD-10-CM | POA: Diagnosis not present

## 2014-01-13 DIAGNOSIS — R7309 Other abnormal glucose: Secondary | ICD-10-CM | POA: Diagnosis not present

## 2014-01-13 DIAGNOSIS — G4700 Insomnia, unspecified: Secondary | ICD-10-CM | POA: Diagnosis not present

## 2014-01-13 DIAGNOSIS — E785 Hyperlipidemia, unspecified: Secondary | ICD-10-CM | POA: Diagnosis not present

## 2014-01-13 DIAGNOSIS — K219 Gastro-esophageal reflux disease without esophagitis: Secondary | ICD-10-CM | POA: Diagnosis not present

## 2014-01-13 DIAGNOSIS — Z0000 Encounter for general adult medical examination without abnormal findings: Secondary | ICD-10-CM | POA: Diagnosis not present

## 2014-01-13 DIAGNOSIS — G4733 Obstructive sleep apnea (adult) (pediatric): Secondary | ICD-10-CM | POA: Diagnosis not present

## 2014-01-13 DIAGNOSIS — G3184 Mild cognitive impairment, so stated: Secondary | ICD-10-CM | POA: Diagnosis not present

## 2014-01-14 ENCOUNTER — Other Ambulatory Visit: Payer: Self-pay

## 2014-01-14 ENCOUNTER — Encounter: Payer: Self-pay | Admitting: Internal Medicine

## 2014-01-14 DIAGNOSIS — Z1231 Encounter for screening mammogram for malignant neoplasm of breast: Secondary | ICD-10-CM

## 2014-01-20 ENCOUNTER — Ambulatory Visit (INDEPENDENT_AMBULATORY_CARE_PROVIDER_SITE_OTHER): Payer: Medicare Other | Admitting: *Deleted

## 2014-01-20 DIAGNOSIS — R55 Syncope and collapse: Secondary | ICD-10-CM | POA: Diagnosis not present

## 2014-01-22 ENCOUNTER — Telehealth: Payer: Self-pay | Admitting: Gastroenterology

## 2014-01-22 ENCOUNTER — Other Ambulatory Visit: Payer: Self-pay

## 2014-01-22 MED ORDER — ALOSETRON HCL 0.5 MG PO TABS: 0.2500 mg | ORAL_TABLET | Freq: Every morning | ORAL | Status: DC

## 2014-01-22 NOTE — Telephone Encounter (Signed)
Prescription printed and patient states she will come by office and pick up.

## 2014-01-27 ENCOUNTER — Ambulatory Visit
Admission: RE | Admit: 2014-01-27 | Discharge: 2014-01-27 | Disposition: A | Discharge status: Home / Self Care | Payer: Medicare Other | Source: Ambulatory Visit

## 2014-01-27 DIAGNOSIS — Z1231 Encounter for screening mammogram for malignant neoplasm of breast: Secondary | ICD-10-CM | POA: Diagnosis not present

## 2014-02-02 LAB — MDC_IDC_ENUM_SESS_TYPE_REMOTE

## 2014-02-20 ENCOUNTER — Encounter: Payer: Self-pay | Admitting: Internal Medicine

## 2014-02-24 ENCOUNTER — Ambulatory Visit (INDEPENDENT_AMBULATORY_CARE_PROVIDER_SITE_OTHER): Payer: Medicare Other | Admitting: *Deleted

## 2014-02-24 DIAGNOSIS — I6350 Cerebral infarction due to unspecified occlusion or stenosis of unspecified cerebral artery: Secondary | ICD-10-CM | POA: Diagnosis not present

## 2014-03-04 ENCOUNTER — Encounter: Payer: Self-pay | Admitting: Internal Medicine

## 2014-03-10 LAB — MDC_IDC_ENUM_SESS_TYPE_REMOTE
MDC IDC SESS DTM: 20150405133311
Zone Setting Detection Interval: 2000 ms
Zone Setting Detection Interval: 3000 ms
Zone Setting Detection Interval: 330 ms

## 2014-03-11 DIAGNOSIS — M1710 Unilateral primary osteoarthritis, unspecified knee: Secondary | ICD-10-CM | POA: Diagnosis not present

## 2014-03-11 DIAGNOSIS — Z471 Aftercare following joint replacement surgery: Secondary | ICD-10-CM | POA: Diagnosis not present

## 2014-03-20 ENCOUNTER — Encounter: Payer: Self-pay | Admitting: Gastroenterology

## 2014-03-20 NOTE — Telephone Encounter (Signed)
Error

## 2014-03-25 DIAGNOSIS — L819 Disorder of pigmentation, unspecified: Secondary | ICD-10-CM | POA: Diagnosis not present

## 2014-03-25 DIAGNOSIS — D1801 Hemangioma of skin and subcutaneous tissue: Secondary | ICD-10-CM | POA: Diagnosis not present

## 2014-03-25 DIAGNOSIS — IMO0002 Reserved for concepts with insufficient information to code with codable children: Secondary | ICD-10-CM | POA: Diagnosis not present

## 2014-03-26 ENCOUNTER — Encounter: Payer: Self-pay | Admitting: Internal Medicine

## 2014-03-27 ENCOUNTER — Ambulatory Visit (INDEPENDENT_AMBULATORY_CARE_PROVIDER_SITE_OTHER): Payer: Medicare Other | Admitting: *Deleted

## 2014-03-27 DIAGNOSIS — R002 Palpitations: Secondary | ICD-10-CM

## 2014-03-27 LAB — MDC_IDC_ENUM_SESS_TYPE_REMOTE
MDC IDC SESS DTM: 20150609140203
MDC IDC SET ZONE DETECTION INTERVAL: 2000 ms
MDC IDC SET ZONE DETECTION INTERVAL: 3000 ms
Zone Setting Detection Interval: 330 ms

## 2014-04-14 LAB — MDC_IDC_ENUM_SESS_TYPE_REMOTE
Date Time Interrogation Session: 20150511141303
MDC IDC SET ZONE DETECTION INTERVAL: 330 ms
Zone Setting Detection Interval: 2000 ms
Zone Setting Detection Interval: 3000 ms

## 2014-04-25 ENCOUNTER — Ambulatory Visit (INDEPENDENT_AMBULATORY_CARE_PROVIDER_SITE_OTHER): Payer: Medicare Other | Admitting: *Deleted

## 2014-04-25 DIAGNOSIS — I6350 Cerebral infarction due to unspecified occlusion or stenosis of unspecified cerebral artery: Secondary | ICD-10-CM

## 2014-04-25 LAB — MDC_IDC_ENUM_SESS_TYPE_REMOTE
MDC IDC SESS DTM: 20150710163253
MDC IDC SET ZONE DETECTION INTERVAL: 330 ms
Zone Setting Detection Interval: 2000 ms
Zone Setting Detection Interval: 3000 ms

## 2014-05-01 NOTE — Progress Notes (Signed)
Loop recorder 

## 2014-05-02 ENCOUNTER — Encounter: Payer: Self-pay | Admitting: Internal Medicine

## 2014-05-27 ENCOUNTER — Encounter: Payer: Self-pay | Admitting: Internal Medicine

## 2014-05-27 ENCOUNTER — Ambulatory Visit (INDEPENDENT_AMBULATORY_CARE_PROVIDER_SITE_OTHER): Payer: Medicare Other | Admitting: *Deleted

## 2014-05-27 DIAGNOSIS — I6350 Cerebral infarction due to unspecified occlusion or stenosis of unspecified cerebral artery: Secondary | ICD-10-CM | POA: Diagnosis not present

## 2014-05-27 LAB — MDC_IDC_ENUM_SESS_TYPE_REMOTE
MDC IDC SESS DTM: 20150814192856
MDC IDC SET ZONE DETECTION INTERVAL: 3000 ms
Zone Setting Detection Interval: 2000 ms
Zone Setting Detection Interval: 330 ms

## 2014-06-02 ENCOUNTER — Encounter: Payer: Self-pay | Admitting: Podiatry

## 2014-06-02 ENCOUNTER — Ambulatory Visit (INDEPENDENT_AMBULATORY_CARE_PROVIDER_SITE_OTHER): Payer: Medicare Other

## 2014-06-02 ENCOUNTER — Ambulatory Visit: Payer: Self-pay | Admitting: Podiatry

## 2014-06-02 ENCOUNTER — Ambulatory Visit (INDEPENDENT_AMBULATORY_CARE_PROVIDER_SITE_OTHER): Payer: Medicare Other | Admitting: Podiatry

## 2014-06-02 VITALS — BP 130/64 | HR 86 | Resp 12

## 2014-06-02 DIAGNOSIS — M2010 Hallux valgus (acquired), unspecified foot: Secondary | ICD-10-CM | POA: Diagnosis not present

## 2014-06-02 DIAGNOSIS — M2040 Other hammer toe(s) (acquired), unspecified foot: Secondary | ICD-10-CM

## 2014-06-02 DIAGNOSIS — I6350 Cerebral infarction due to unspecified occlusion or stenosis of unspecified cerebral artery: Secondary | ICD-10-CM | POA: Diagnosis not present

## 2014-06-02 NOTE — Progress Notes (Signed)
Loop recorder 

## 2014-06-02 NOTE — Progress Notes (Signed)
   Subjective:    Patient ID: Angelica Guerrero, female    DOB: 1946/09/18, 68 y.o.   MRN: [id number]  HPI PT STATED B/L 2ND TOE ARE CURVING IN FOR LONG TERM 30 YEARS. THE TOE DOES NOT HURT UNLESS WEARING CERTAIN SHOES. THE TOES ARE GETTING WORSE AND NOT GETTING ANY BETTER.   Review of Systems  HENT: Positive for hearing loss.   Allergic/Immunologic: Positive for environmental allergies.  All other systems reviewed and are negative.      Objective:   Physical Exam        Assessment & Plan:

## 2014-06-02 NOTE — Progress Notes (Signed)
Subjective:     Patient ID: Angelica Guerrero, female   DOB: September 16, 1946, 68 y.o.   MRN: [id number]  HPI patient presents stating I have these hammertoes that are getting worse and might bunions can bother me and I know I need to have these fixed. It's becoming increasingly difficult to wear closed in shoes over the last year   Review of Systems  All other systems reviewed and are negative.      Objective:   Physical Exam  Nursing note and vitals reviewed. Cardiovascular: Intact distal pulses.   Musculoskeletal: Normal range of motion.  Neurological: She is alert.  Skin: Skin is warm.   neurovascular status found to be intact with range of motion of the subtalar and midtarsal joint within normal limits and muscle strength adequate. 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 I do think correction is in her best interest as they are getting worse and she wants to have this done and I have recommended Liane Comber bunionectomy first of the left foot with pin fixation digital fusion digits 23 with possible release of the MPJ and distal arthroplasty digits 4 left foot. Patient is scheduled for outpatient surgery

## 2014-06-18 ENCOUNTER — Ambulatory Visit (INDEPENDENT_AMBULATORY_CARE_PROVIDER_SITE_OTHER): Payer: Medicare Other | Admitting: *Deleted

## 2014-06-18 DIAGNOSIS — I6350 Cerebral infarction due to unspecified occlusion or stenosis of unspecified cerebral artery: Secondary | ICD-10-CM

## 2014-06-18 LAB — MDC_IDC_ENUM_SESS_TYPE_REMOTE
MDC IDC SESS DTM: 20150901155356
MDC IDC SET ZONE DETECTION INTERVAL: 3000 ms
Zone Setting Detection Interval: 2000 ms
Zone Setting Detection Interval: 330 ms

## 2014-07-03 NOTE — Progress Notes (Signed)
Loop recorder 

## 2014-07-04 ENCOUNTER — Institutional Professional Consult (permissible substitution): Payer: Medicare Other | Admitting: Pulmonary Disease

## 2014-07-10 ENCOUNTER — Encounter: Payer: Self-pay | Admitting: Internal Medicine

## 2014-07-23 ENCOUNTER — Ambulatory Visit (INDEPENDENT_AMBULATORY_CARE_PROVIDER_SITE_OTHER): Payer: Medicare Other | Admitting: *Deleted

## 2014-07-23 DIAGNOSIS — I639 Cerebral infarction, unspecified: Secondary | ICD-10-CM | POA: Diagnosis not present

## 2014-07-28 ENCOUNTER — Ambulatory Visit: Payer: Medicare Other | Admitting: Podiatry

## 2014-07-30 NOTE — Progress Notes (Signed)
Loop recorder 

## 2014-08-01 LAB — MDC_IDC_ENUM_SESS_TYPE_REMOTE
MDC IDC SESS DTM: 20151002143218
MDC IDC SET ZONE DETECTION INTERVAL: 3000 ms
Zone Setting Detection Interval: 2000 ms
Zone Setting Detection Interval: 330 ms

## 2014-08-20 DIAGNOSIS — Z23 Encounter for immunization: Secondary | ICD-10-CM | POA: Diagnosis not present

## 2014-08-21 ENCOUNTER — Encounter: Payer: Self-pay | Admitting: Internal Medicine

## 2014-08-25 ENCOUNTER — Institutional Professional Consult (permissible substitution): Payer: Medicare Other | Admitting: Pulmonary Disease

## 2014-08-26 ENCOUNTER — Ambulatory Visit (INDEPENDENT_AMBULATORY_CARE_PROVIDER_SITE_OTHER): Payer: Medicare Other | Admitting: *Deleted

## 2014-08-26 DIAGNOSIS — I639 Cerebral infarction, unspecified: Secondary | ICD-10-CM | POA: Diagnosis not present

## 2014-08-26 LAB — MDC_IDC_ENUM_SESS_TYPE_REMOTE
Date Time Interrogation Session: 20151115202140
MDC IDC SET ZONE DETECTION INTERVAL: 2000 ms
MDC IDC SET ZONE DETECTION INTERVAL: 3000 ms
Zone Setting Detection Interval: 330 ms

## 2014-09-03 NOTE — Progress Notes (Signed)
Loop recorder 

## 2014-09-18 ENCOUNTER — Encounter: Payer: Self-pay | Admitting: Internal Medicine

## 2014-09-24 ENCOUNTER — Ambulatory Visit (INDEPENDENT_AMBULATORY_CARE_PROVIDER_SITE_OTHER): Payer: Medicare Other | Admitting: *Deleted

## 2014-09-24 DIAGNOSIS — I639 Cerebral infarction, unspecified: Secondary | ICD-10-CM | POA: Diagnosis not present

## 2014-09-25 ENCOUNTER — Encounter: Payer: Self-pay | Admitting: Internal Medicine

## 2014-09-25 ENCOUNTER — Encounter (HOSPITAL_COMMUNITY): Payer: Self-pay | Admitting: Internal Medicine

## 2014-09-26 NOTE — Progress Notes (Signed)
Loop recorder 

## 2014-10-15 LAB — MDC_IDC_ENUM_SESS_TYPE_REMOTE
MDC IDC SESS DTM: 20151206193438
Zone Setting Detection Interval: 2000 ms
Zone Setting Detection Interval: 3000 ms
Zone Setting Detection Interval: 330 ms

## 2014-10-24 ENCOUNTER — Ambulatory Visit (INDEPENDENT_AMBULATORY_CARE_PROVIDER_SITE_OTHER): Payer: Medicare Other | Admitting: *Deleted

## 2014-10-24 DIAGNOSIS — I639 Cerebral infarction, unspecified: Secondary | ICD-10-CM | POA: Diagnosis not present

## 2014-10-25 LAB — MDC_IDC_ENUM_SESS_TYPE_REMOTE
MDC IDC SESS DTM: 20160109210541
MDC IDC SET ZONE DETECTION INTERVAL: 2000 ms
MDC IDC SET ZONE DETECTION INTERVAL: 3000 ms
Zone Setting Detection Interval: 330 ms

## 2014-10-28 ENCOUNTER — Encounter: Payer: Self-pay | Admitting: Internal Medicine

## 2014-10-31 NOTE — Progress Notes (Signed)
Loop recorder 

## 2014-11-07 IMAGING — MG MM SCREENING BREAST TOMO BILATERAL
9 of 12 series · 9 of 28 positions shown · non-contrast
Comparison: Previous exam(s).

CLINICAL DATA: Screening. Family history of breast cancer in a
cousin at age 35.

EXAM:
DIGITAL SCREENING BILATERAL MAMMOGRAM WITH 3D TOMO WITH CAD

[R MLO synth-2D]
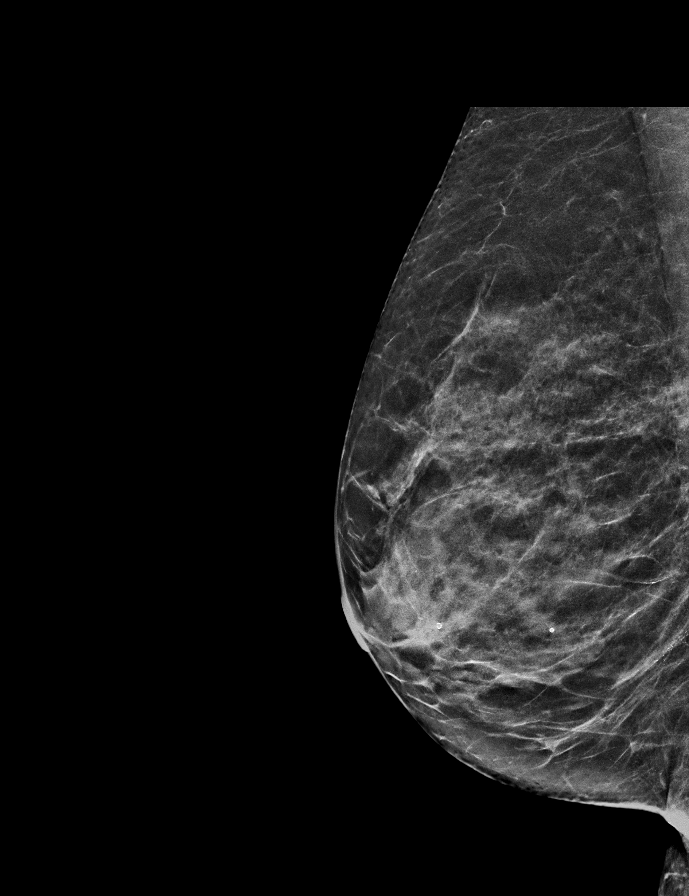

[L MLO]
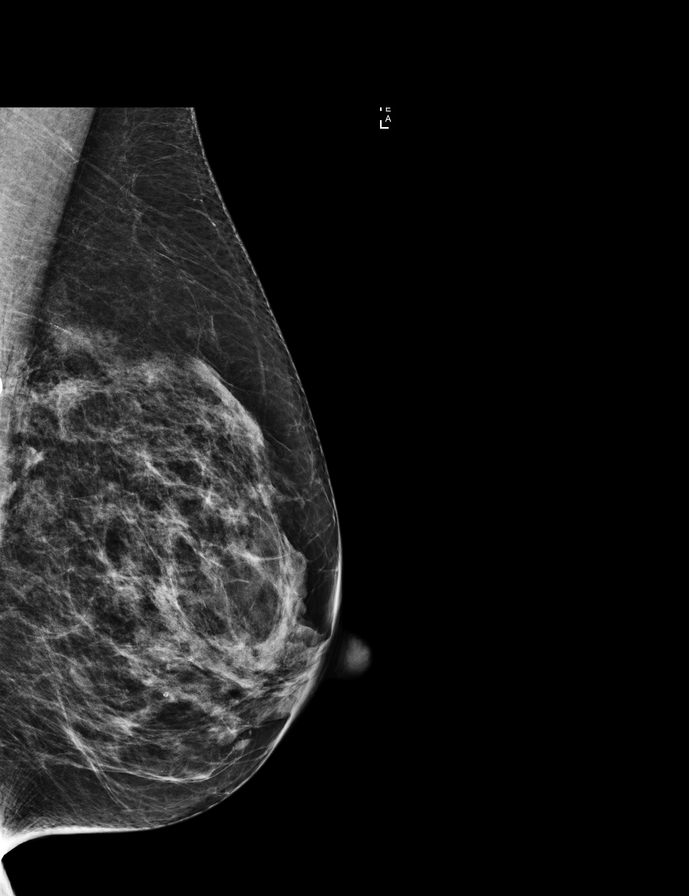

[R CC]
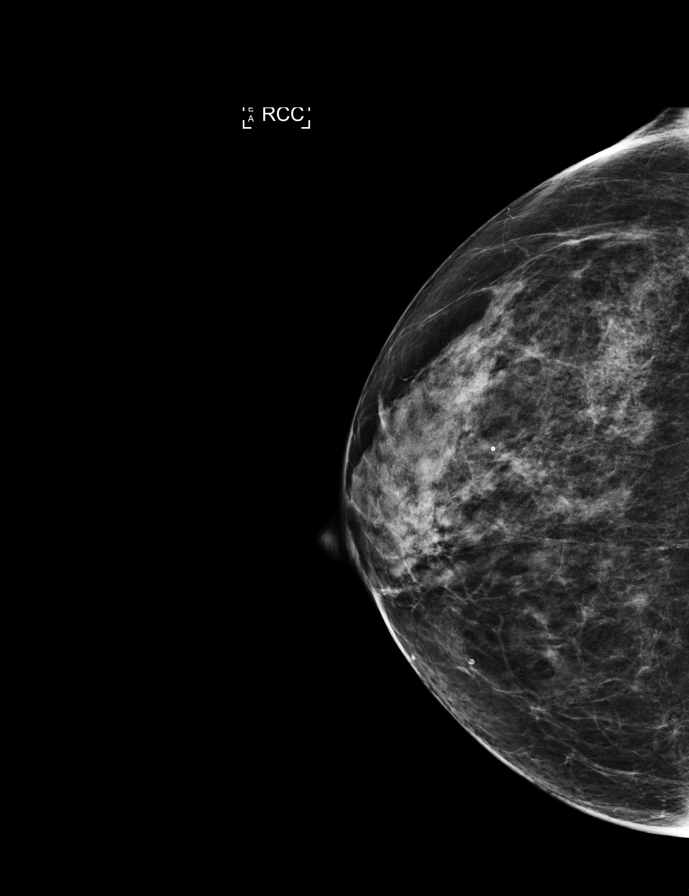

[R CC synth-2D]
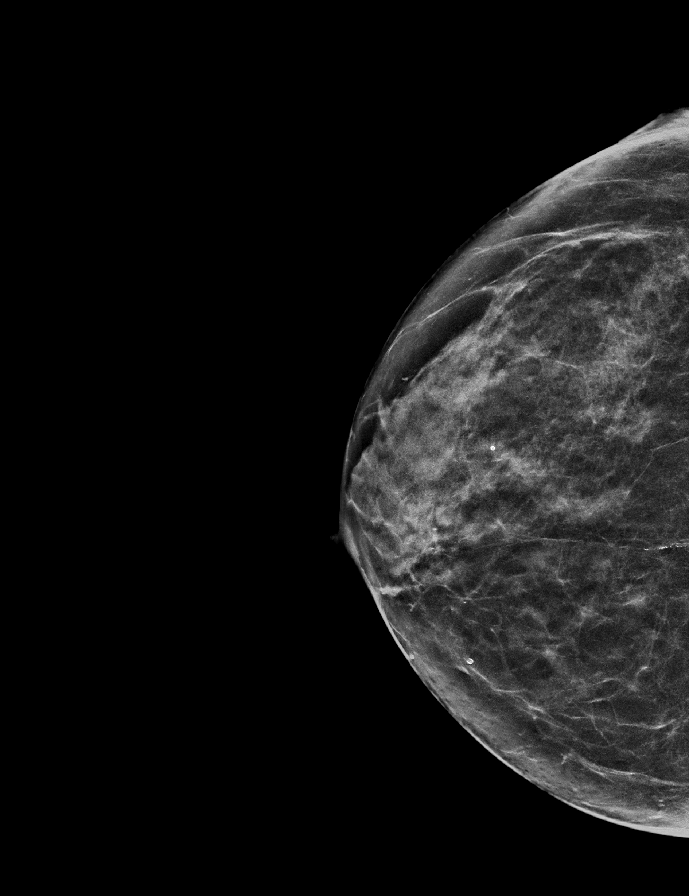

[L CC synth-2D]
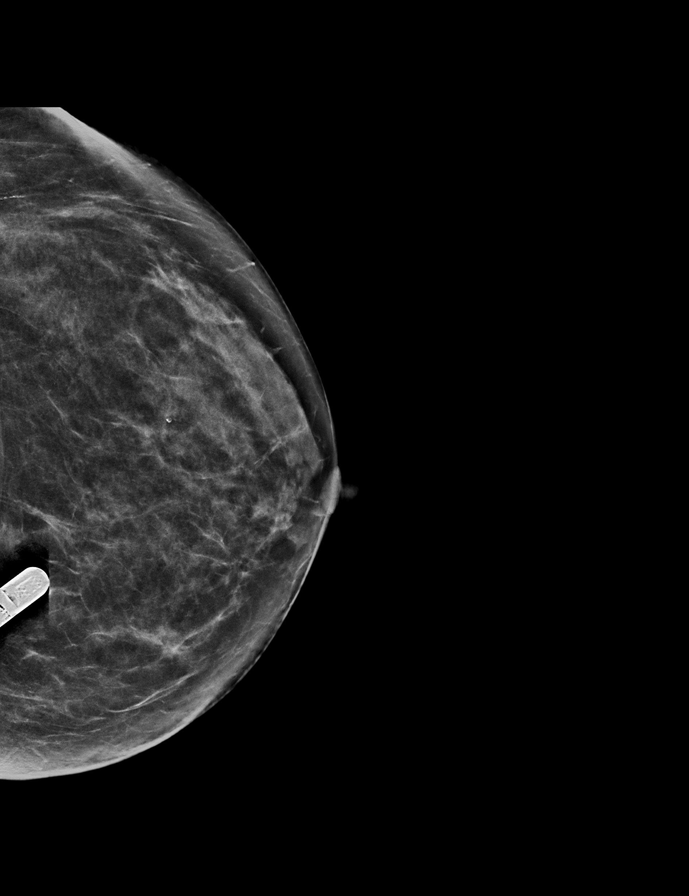

[R MLO]
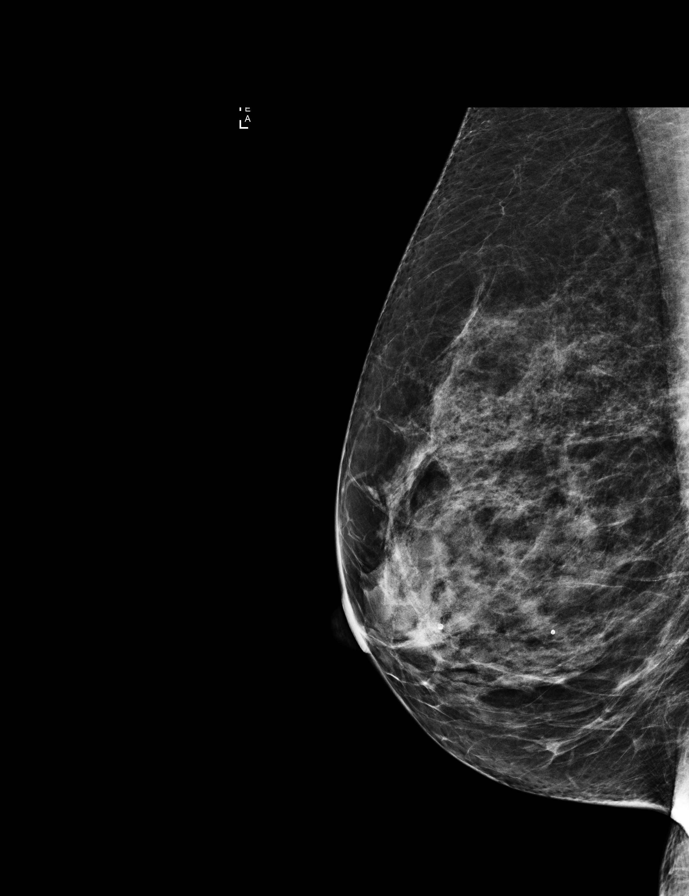

[L MLO synth-2D]
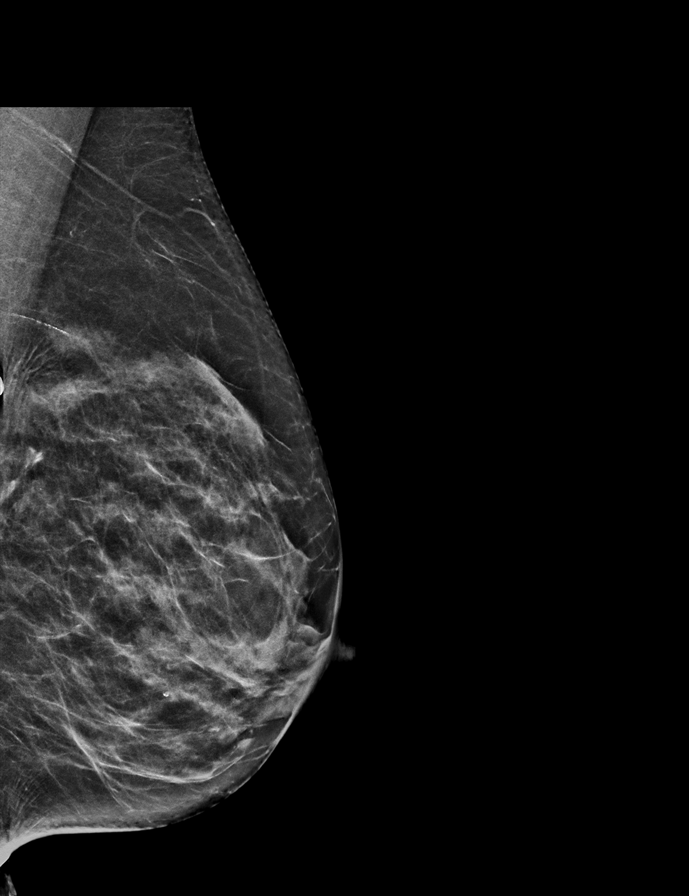

[L CC]
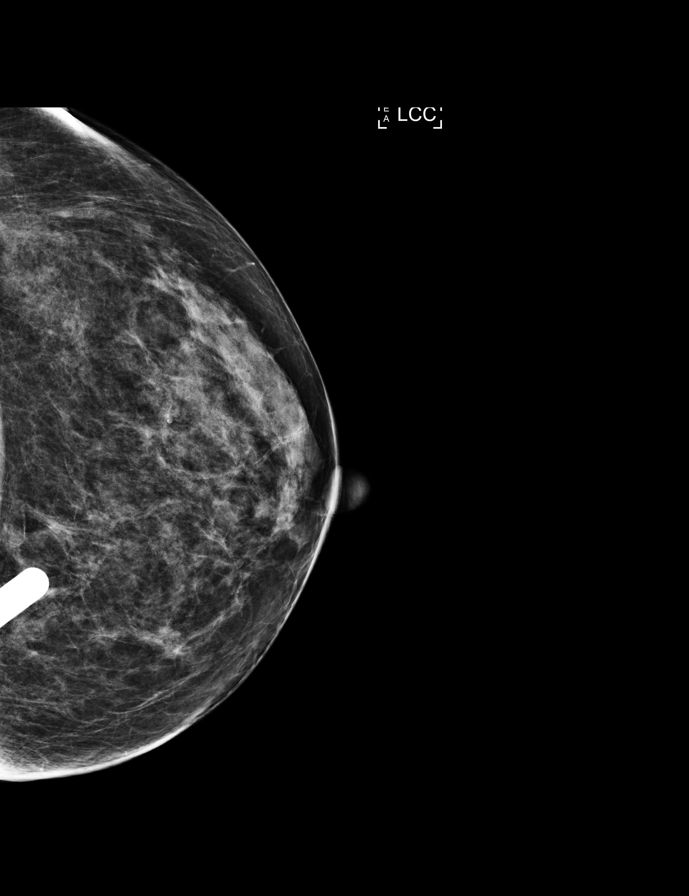

[L MLO tomo · tomo slice 29/57.0]
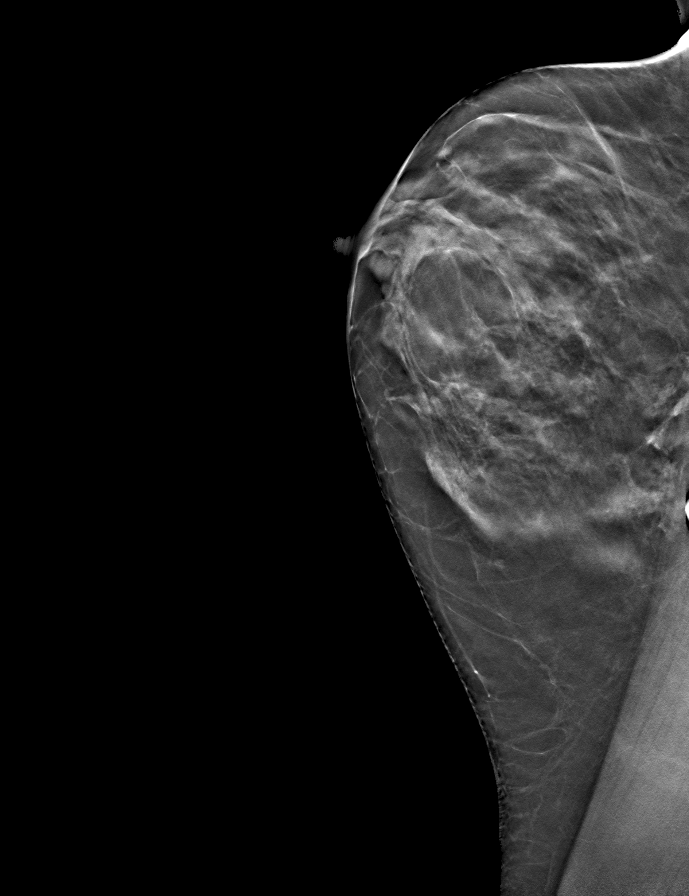

[9 of 28 positions shown; findings below may reference images not displayed]

ACR Breast Density Category c: The breast tissue is heterogeneously
dense, which may obscure small masses.
FINDINGS: There are no findings suspicious for malignancy. Images were
processed with CAD.
IMPRESSION: No mammographic evidence of malignancy. A result letter of this
screening mammogram will be mailed directly to the patient.

RECOMMENDATION:
Screening mammogram in one year. (Code:AB-[id number])

BI-RADS CATEGORY  1: Negative.

## 2014-11-10 DIAGNOSIS — M79672 Pain in left foot: Secondary | ICD-10-CM | POA: Diagnosis not present

## 2014-11-10 DIAGNOSIS — M79671 Pain in right foot: Secondary | ICD-10-CM | POA: Diagnosis not present

## 2014-11-10 DIAGNOSIS — M2041 Other hammer toe(s) (acquired), right foot: Secondary | ICD-10-CM | POA: Diagnosis not present

## 2014-11-10 DIAGNOSIS — M2042 Other hammer toe(s) (acquired), left foot: Secondary | ICD-10-CM | POA: Diagnosis not present

## 2014-11-24 ENCOUNTER — Ambulatory Visit (INDEPENDENT_AMBULATORY_CARE_PROVIDER_SITE_OTHER): Payer: Medicare Other | Admitting: *Deleted

## 2014-11-24 DIAGNOSIS — I639 Cerebral infarction, unspecified: Secondary | ICD-10-CM

## 2014-11-24 LAB — MDC_IDC_ENUM_SESS_TYPE_REMOTE
Date Time Interrogation Session: 20160123150523
MDC IDC SET ZONE DETECTION INTERVAL: 2000 ms
MDC IDC SET ZONE DETECTION INTERVAL: 3000 ms
MDC IDC SET ZONE DETECTION INTERVAL: 330 ms

## 2014-11-24 NOTE — Progress Notes (Signed)
Loop recorder 

## 2014-12-02 ENCOUNTER — Encounter: Payer: Self-pay | Admitting: Cardiology

## 2014-12-03 ENCOUNTER — Encounter: Payer: Self-pay | Admitting: Internal Medicine

## 2014-12-09 ENCOUNTER — Encounter: Payer: Medicare Other | Admitting: Internal Medicine

## 2014-12-22 ENCOUNTER — Encounter: Payer: Self-pay | Admitting: Internal Medicine

## 2014-12-24 ENCOUNTER — Ambulatory Visit (INDEPENDENT_AMBULATORY_CARE_PROVIDER_SITE_OTHER): Payer: Medicare Other | Admitting: *Deleted

## 2014-12-24 DIAGNOSIS — I639 Cerebral infarction, unspecified: Secondary | ICD-10-CM | POA: Diagnosis not present

## 2014-12-25 NOTE — Progress Notes (Signed)
Loop recorder 

## 2014-12-31 ENCOUNTER — Encounter: Payer: Self-pay | Admitting: Internal Medicine

## 2014-12-31 ENCOUNTER — Ambulatory Visit (INDEPENDENT_AMBULATORY_CARE_PROVIDER_SITE_OTHER): Payer: Medicare Other | Admitting: Internal Medicine

## 2014-12-31 VITALS — BP 120/62 | HR 73 | Ht 66.0 in | Wt 146.0 lb

## 2014-12-31 DIAGNOSIS — I639 Cerebral infarction, unspecified: Secondary | ICD-10-CM | POA: Diagnosis not present

## 2014-12-31 DIAGNOSIS — G453 Amaurosis fugax: Secondary | ICD-10-CM

## 2014-12-31 DIAGNOSIS — Z4509 Encounter for adjustment and management of other cardiac device: Secondary | ICD-10-CM | POA: Diagnosis not present

## 2014-12-31 LAB — MDC_IDC_ENUM_SESS_TYPE_INCLINIC
Date Time Interrogation Session: 20160316163936
Zone Setting Detection Interval: 2000 ms
Zone Setting Detection Interval: 3000 ms
Zone Setting Detection Interval: 330 ms

## 2014-12-31 NOTE — Assessment & Plan Note (Signed)
She has had no recurrent symptoms. She will continue aspirin therapy.

## 2014-12-31 NOTE — Assessment & Plan Note (Signed)
She will continue Crestor and maintain a low-fat diet.

## 2014-12-31 NOTE — Progress Notes (Signed)
HPI [name] returns today for followup. 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 She underwent insertion of an ILR. Since then she has been stable with no chest pain, sob, or palpitations. She remains active.  Allergies  Allergen Reactions  . Lactose Intolerance (Gi) Diarrhea  . Morphine And Related Itching     Current Outpatient Prescriptions  Medication Sig Dispense Refill  . alosetron (LOTRONEX) 0.5 MG tablet Take 0.5 tablets (0.25 mg total) by mouth every morning. 60 tablet 2  . aspirin EC 81 MG tablet Take 81 mg by mouth every morning.     Marland Kitchen buPROPion (WELLBUTRIN XL) 150 MG 24 hr tablet Take 150 mg by mouth every morning.    . celecoxib (CELEBREX) 200 MG capsule Take 200 mg by mouth every morning.     Marland Kitchen esomeprazole (NEXIUM) 40 MG capsule Take 40 mg by mouth daily before breakfast.    . FLUoxetine (PROZAC) 40 MG capsule Take 40 mg by mouth every morning.     . rosuvastatin (CRESTOR) 40 MG tablet Take 20 mg by mouth every morning.      No current facility-administered medications for this visit.     Past Medical History  Diagnosis Date  . Snoring   . Cough   . Aortic valve disorders     "maybe prolapse"  . Other and unspecified hyperlipidemia   . Dysphagia, oropharyngeal phase   . GERD (gastroesophageal reflux disease)   . Obstructive sleep apnea   . Hyperlipemia   . Depression   . Status post placement of implantable loop recorder   . External hemorrhoids 1969  . Migraine, unspecified, without mention of intractable migraine without mention of status migrainosus   . Osteoarthrosis, unspecified whether generalized or localized, unspecified site     ROS:   All systems reviewed and negative except as noted in the HPI.   Past Surgical History  Procedure Laterality Date  . Total abdominal hysterectomy    . Tubal ligation      bilateral  . Total knee arthroplasty  2009    right knee    . Facial cosmetic surgery    . Bladder surgery      Tacking with hysterectomy  . Tonsillectomy    . 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 Knee arthroscopy Left 08/07/2013    Procedure: LEFT ARTHROSCOPY KNEE WITH DEBRIDEMENT;  Surgeon: [name], MD;  Location: [facility];  Service: Orthopedics;  Laterality: Left;  . Loop recorder implant Left 01/11/2013    Procedure: LOOP RECORDER IMPLANT;  Surgeon: Evans Lance, MD;  Location: [facility] CATH LAB;  Service: Cardiovascular;  Laterality: Left;     Family History  Problem Relation Age of Onset  . Alzheimer's disease Mother   . Hyperlipidemia Mother   . Diabetes Father     type II  . Allergies Father   . Asthma Father   . Factor IX deficiency Brother   . Factor IX deficiency Brother   . Allergies Child   . Asthma Grandchild   . Rheum arthritis Maternal Grandmother   . Colon cancer Neg Hx   . Colon polyps Paternal Uncle   . Dementia Paternal Grandmother   . Alzheimer's disease Maternal Aunt      History   Social History  . Marital Status: Married    Spouse Name: N/A  .  Number of Children: 3  . 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 Smoking status: Former Smoker -- 1.00 packs/day for 10 years    Types: Cigarettes    Quit date: 10/17/2000  . Smokeless tobacco: Never Used  . Alcohol Use: No     Comment: glass of wine sometimes  . Drug Use: No  . Sexual Activity: Not on file   Other Topics Concern  . Not on file   Social History Narrative   Pt is a housewife.   Pt lives with her husband Dr. Sharlett Iles.  They have three grown children and 8 grandchildren.   Highest level of education:  4-years of college, B.S.     BP 120/62 mmHg  Pulse 73  Ht 5\' 6"  (1.676 m)  Wt 146 lb (66.225 kg)  BMI 23.58 kg/m2  Physical Exam:  Well appearing 69 yo woman, NAD HEENT:  Unremarkable Neck:  No JVD, no thyromegally Back:  No CVA tenderness Lungs:  Clear with no wheezes HEART:  Regular rate rhythm, no murmurs, no rubs, no clicks Abd:  soft, positive bowel sounds, no organomegally, no rebound, no guarding Ext:  2 plus pulses, no edema, no cyanosis, no clubbing Skin:  No rashes no nodules Neuro:  CN II through XII intact, motor grossly intact  DEVICE  Normal device function.  See PaceArt for details. There is T wave over sensing noted.  Assess/Plan:

## 2015-01-05 ENCOUNTER — Other Ambulatory Visit: Payer: Self-pay | Admitting: Dermatology

## 2015-01-05 DIAGNOSIS — D485 Neoplasm of uncertain behavior of skin: Secondary | ICD-10-CM | POA: Diagnosis not present

## 2015-01-05 DIAGNOSIS — L720 Epidermal cyst: Secondary | ICD-10-CM | POA: Diagnosis not present

## 2015-01-05 DIAGNOSIS — C44319 Basal cell carcinoma of skin of other parts of face: Secondary | ICD-10-CM | POA: Diagnosis not present

## 2015-01-05 DIAGNOSIS — D0439 Carcinoma in situ of skin of other parts of face: Secondary | ICD-10-CM | POA: Diagnosis not present

## 2015-01-07 LAB — MDC_IDC_ENUM_SESS_TYPE_REMOTE
Date Time Interrogation Session: 20160322012207
MDC IDC SET ZONE DETECTION INTERVAL: 2000 ms
MDC IDC SET ZONE DETECTION INTERVAL: 3000 ms
Zone Setting Detection Interval: 330 ms

## 2015-01-13 DIAGNOSIS — M2011 Hallux valgus (acquired), right foot: Secondary | ICD-10-CM | POA: Diagnosis not present

## 2015-01-13 DIAGNOSIS — M2041 Other hammer toe(s) (acquired), right foot: Secondary | ICD-10-CM | POA: Diagnosis not present

## 2015-01-13 DIAGNOSIS — G8918 Other acute postprocedural pain: Secondary | ICD-10-CM | POA: Diagnosis not present

## 2015-01-13 DIAGNOSIS — M2061 Acquired deformities of toe(s), unspecified, right foot: Secondary | ICD-10-CM | POA: Diagnosis not present

## 2015-01-23 ENCOUNTER — Ambulatory Visit (INDEPENDENT_AMBULATORY_CARE_PROVIDER_SITE_OTHER): Payer: Medicare Other | Admitting: *Deleted

## 2015-01-23 DIAGNOSIS — I639 Cerebral infarction, unspecified: Secondary | ICD-10-CM | POA: Diagnosis not present

## 2015-01-26 NOTE — Progress Notes (Signed)
Loop recorder 

## 2015-01-30 ENCOUNTER — Encounter: Payer: Self-pay | Admitting: Internal Medicine

## 2015-02-23 ENCOUNTER — Ambulatory Visit (INDEPENDENT_AMBULATORY_CARE_PROVIDER_SITE_OTHER): Payer: Medicare Other | Admitting: *Deleted

## 2015-02-23 DIAGNOSIS — I639 Cerebral infarction, unspecified: Secondary | ICD-10-CM | POA: Diagnosis not present

## 2015-02-24 LAB — CUP PACEART REMOTE DEVICE CHECK
Date Time Interrogation Session: 20160329010903
MDC IDC SET ZONE DETECTION INTERVAL: 3000 ms
Zone Setting Detection Interval: 2000 ms
Zone Setting Detection Interval: 330 ms

## 2015-02-27 NOTE — Progress Notes (Signed)
Loop recorder 

## 2015-03-11 ENCOUNTER — Other Ambulatory Visit: Payer: Self-pay

## 2015-03-11 MED ORDER — ALOSETRON HCL 0.5 MG PO TABS: 0.2500 mg | ORAL_TABLET | Freq: Every morning | ORAL | Status: DC

## 2015-03-12 NOTE — Progress Notes (Signed)
Patient ID: Angelica Guerrero, female   DOB: 08-28-1946, 69 y.o.   MRN: [id number]   Received faxed approval for patient's Lotronex from [employer] and the coverage is approved through 12/11/14-03/10/16.

## 2015-03-17 LAB — CUP PACEART REMOTE DEVICE CHECK: MDC IDC SESS DTM: 20160531165910

## 2015-03-24 ENCOUNTER — Ambulatory Visit (INDEPENDENT_AMBULATORY_CARE_PROVIDER_SITE_OTHER): Payer: Medicare Other | Admitting: *Deleted

## 2015-03-24 DIAGNOSIS — I639 Cerebral infarction, unspecified: Secondary | ICD-10-CM | POA: Diagnosis not present

## 2015-03-25 NOTE — Progress Notes (Signed)
Loop recorder 

## 2015-04-01 ENCOUNTER — Encounter: Payer: Self-pay | Admitting: Cardiology

## 2015-04-02 LAB — CUP PACEART REMOTE DEVICE CHECK: MDC IDC SESS DTM: 20160616124810

## 2015-04-06 ENCOUNTER — Encounter: Payer: Self-pay | Admitting: Internal Medicine

## 2015-04-06 DIAGNOSIS — M79672 Pain in left foot: Secondary | ICD-10-CM | POA: Diagnosis not present

## 2015-04-07 ENCOUNTER — Encounter: Payer: Self-pay | Admitting: Internal Medicine

## 2015-04-08 ENCOUNTER — Encounter: Payer: Self-pay | Admitting: Cardiology

## 2015-04-08 ENCOUNTER — Encounter: Payer: Self-pay | Admitting: Gastroenterology

## 2015-04-10 ENCOUNTER — Encounter: Payer: Self-pay | Admitting: Internal Medicine

## 2015-04-15 ENCOUNTER — Telehealth: Payer: Self-pay | Admitting: *Deleted

## 2015-04-15 NOTE — Telephone Encounter (Signed)
I returned pt's msg on my VM. Pt had received notices for disconnected monitor. Pt wants to confirm Carelink function.   LMOVM w/ my direct # to call back.

## 2015-04-22 NOTE — Telephone Encounter (Signed)
Updated pt we are receiving all of her daily manual transmissions. Let pt know her monitor is automatic; no more daily manual transmissions are necessary unless we call. Pt thankful.

## 2015-04-23 ENCOUNTER — Ambulatory Visit (INDEPENDENT_AMBULATORY_CARE_PROVIDER_SITE_OTHER): Payer: Medicare Other | Admitting: *Deleted

## 2015-04-23 DIAGNOSIS — I639 Cerebral infarction, unspecified: Secondary | ICD-10-CM | POA: Diagnosis not present

## 2015-04-23 NOTE — Progress Notes (Signed)
Loop recorder 

## 2015-04-24 LAB — CUP PACEART REMOTE DEVICE CHECK: Date Time Interrogation Session: 20160708115154

## 2015-05-21 ENCOUNTER — Encounter: Payer: Self-pay | Admitting: Internal Medicine

## 2015-05-22 ENCOUNTER — Ambulatory Visit (INDEPENDENT_AMBULATORY_CARE_PROVIDER_SITE_OTHER): Payer: Medicare Other | Admitting: *Deleted

## 2015-05-22 DIAGNOSIS — I639 Cerebral infarction, unspecified: Secondary | ICD-10-CM

## 2015-05-26 NOTE — Progress Notes (Signed)
Loop recorder 

## 2015-06-01 LAB — CUP PACEART REMOTE DEVICE CHECK: Date Time Interrogation Session: 20160815145114

## 2015-06-08 DIAGNOSIS — R7301 Impaired fasting glucose: Secondary | ICD-10-CM | POA: Diagnosis not present

## 2015-06-08 DIAGNOSIS — G3184 Mild cognitive impairment, so stated: Secondary | ICD-10-CM | POA: Diagnosis not present

## 2015-06-08 DIAGNOSIS — G4700 Insomnia, unspecified: Secondary | ICD-10-CM | POA: Diagnosis not present

## 2015-06-08 DIAGNOSIS — Z6823 Body mass index (BMI) 23.0-23.9, adult: Secondary | ICD-10-CM | POA: Diagnosis not present

## 2015-06-08 DIAGNOSIS — G4733 Obstructive sleep apnea (adult) (pediatric): Secondary | ICD-10-CM | POA: Diagnosis not present

## 2015-06-08 DIAGNOSIS — E739 Lactose intolerance, unspecified: Secondary | ICD-10-CM | POA: Diagnosis not present

## 2015-06-08 DIAGNOSIS — R8290 Unspecified abnormal findings in urine: Secondary | ICD-10-CM | POA: Diagnosis not present

## 2015-06-08 DIAGNOSIS — Z0000 Encounter for general adult medical examination without abnormal findings: Secondary | ICD-10-CM | POA: Diagnosis not present

## 2015-06-08 DIAGNOSIS — G453 Amaurosis fugax: Secondary | ICD-10-CM | POA: Diagnosis not present

## 2015-06-08 DIAGNOSIS — Z23 Encounter for immunization: Secondary | ICD-10-CM | POA: Diagnosis not present

## 2015-06-08 DIAGNOSIS — N390 Urinary tract infection, site not specified: Secondary | ICD-10-CM | POA: Diagnosis not present

## 2015-06-08 DIAGNOSIS — F325 Major depressive disorder, single episode, in full remission: Secondary | ICD-10-CM | POA: Diagnosis not present

## 2015-06-08 DIAGNOSIS — E785 Hyperlipidemia, unspecified: Secondary | ICD-10-CM | POA: Diagnosis not present

## 2015-06-10 ENCOUNTER — Telehealth: Payer: Self-pay | Admitting: *Deleted

## 2015-06-10 NOTE — Telephone Encounter (Signed)
Pt is aware that she needs an appointment to have loop recorder reprogrammed due to oversensing. Pt agreed to appt on 06-15-2015 at 3:30 PM.

## 2015-06-10 NOTE — Telephone Encounter (Signed)
Angelica Guerrero requesting call back.  Patient needs appointment with device clinic for [facility] reprogramming for oversensing.

## 2015-06-11 ENCOUNTER — Other Ambulatory Visit: Payer: Self-pay | Admitting: Internal Medicine

## 2015-06-11 DIAGNOSIS — Z1231 Encounter for screening mammogram for malignant neoplasm of breast: Secondary | ICD-10-CM

## 2015-06-15 ENCOUNTER — Ambulatory Visit (INDEPENDENT_AMBULATORY_CARE_PROVIDER_SITE_OTHER): Payer: Medicare Other | Admitting: *Deleted

## 2015-06-15 DIAGNOSIS — R55 Syncope and collapse: Secondary | ICD-10-CM

## 2015-06-15 LAB — CUP PACEART INCLINIC DEVICE CHECK
MDC IDC SESS DTM: 20160829160558
MDC IDC SET ZONE DETECTION INTERVAL: 330 ms
Zone Setting Detection Interval: 2000 ms
Zone Setting Detection Interval: 3000 ms

## 2015-06-15 NOTE — Progress Notes (Signed)
ILR reprogramming for oversensing. Sensing threshold decay delay changed frrom 165ms to 457ms. Monthly Carelink summary reports, ROV with GT in March.

## 2015-06-17 ENCOUNTER — Encounter: Payer: Self-pay | Admitting: Internal Medicine

## 2015-06-18 ENCOUNTER — Encounter: Payer: Self-pay | Admitting: Internal Medicine

## 2015-06-23 ENCOUNTER — Encounter: Payer: Self-pay | Admitting: Internal Medicine

## 2015-06-23 ENCOUNTER — Ambulatory Visit (INDEPENDENT_AMBULATORY_CARE_PROVIDER_SITE_OTHER): Payer: Medicare Other | Admitting: *Deleted

## 2015-06-23 DIAGNOSIS — I639 Cerebral infarction, unspecified: Secondary | ICD-10-CM

## 2015-06-24 NOTE — Progress Notes (Signed)
Loop recorder 

## 2015-07-16 ENCOUNTER — Encounter: Payer: Self-pay | Admitting: Cardiology

## 2015-07-16 LAB — CUP PACEART REMOTE DEVICE CHECK: Date Time Interrogation Session: 20160906040500

## 2015-07-16 NOTE — Progress Notes (Signed)
Carelink summary report received. Battery status OK. Normal device function. No new symptom, tachy, brady, or AF episodes. 4 tachy episodes--all oversensed. Monthly summary reports and ROV with GT in 12/2015.

## 2015-07-22 ENCOUNTER — Ambulatory Visit (INDEPENDENT_AMBULATORY_CARE_PROVIDER_SITE_OTHER): Payer: Medicare Other | Admitting: *Deleted

## 2015-07-22 DIAGNOSIS — I639 Cerebral infarction, unspecified: Secondary | ICD-10-CM | POA: Diagnosis not present

## 2015-07-22 NOTE — Progress Notes (Signed)
Loop recorder 

## 2015-07-23 ENCOUNTER — Ambulatory Visit
Admission: RE | Admit: 2015-07-23 | Discharge: 2015-07-23 | Disposition: A | Discharge status: Home / Self Care | Payer: Medicare Other | Source: Ambulatory Visit | Attending: Internal Medicine | Admitting: Internal Medicine

## 2015-07-23 ENCOUNTER — Encounter: Payer: Self-pay | Admitting: Cardiology

## 2015-07-23 DIAGNOSIS — H25813 Combined forms of age-related cataract, bilateral: Secondary | ICD-10-CM | POA: Diagnosis not present

## 2015-07-23 DIAGNOSIS — H04123 Dry eye syndrome of bilateral lacrimal glands: Secondary | ICD-10-CM | POA: Diagnosis not present

## 2015-07-23 DIAGNOSIS — H43811 Vitreous degeneration, right eye: Secondary | ICD-10-CM | POA: Diagnosis not present

## 2015-07-23 DIAGNOSIS — Z1231 Encounter for screening mammogram for malignant neoplasm of breast: Secondary | ICD-10-CM

## 2015-07-23 DIAGNOSIS — H524 Presbyopia: Secondary | ICD-10-CM | POA: Diagnosis not present

## 2015-07-29 ENCOUNTER — Telehealth: Payer: Self-pay | Admitting: Internal Medicine

## 2015-07-29 NOTE — Telephone Encounter (Signed)
New Message  Pt says that she received a letter about sending a transmission. She is unsure why she is receiving it because it is a remote . Please f/u with her  Thanks

## 2015-07-30 NOTE — Telephone Encounter (Signed)
LMOVM for pt to return call 

## 2015-08-03 NOTE — Telephone Encounter (Signed)
2nd attempt  LMOVM for pt to return call.  

## 2015-08-11 ENCOUNTER — Encounter: Payer: Self-pay | Admitting: Internal Medicine

## 2015-08-11 LAB — CUP PACEART REMOTE DEVICE CHECK: Date Time Interrogation Session: 20161005113603

## 2015-08-11 NOTE — Progress Notes (Signed)
Carelink summary report received. Battery status OK. Normal device function. No new symptom, brady, pause, or AF episodes. 2 tachy episodes--T-wave oversensing. Monthly summary reports and ROV with GT in 12/2015.

## 2015-08-18 ENCOUNTER — Encounter: Payer: Self-pay | Admitting: Internal Medicine

## 2015-08-21 ENCOUNTER — Ambulatory Visit (INDEPENDENT_AMBULATORY_CARE_PROVIDER_SITE_OTHER): Payer: Medicare Other | Admitting: *Deleted

## 2015-08-21 DIAGNOSIS — I639 Cerebral infarction, unspecified: Secondary | ICD-10-CM

## 2015-08-21 NOTE — Progress Notes (Signed)
Carelink Summary Report / Loop recorder 

## 2015-09-20 LAB — CUP PACEART REMOTE DEVICE CHECK: Date Time Interrogation Session: 20161104120525

## 2015-09-20 NOTE — Progress Notes (Signed)
  Carelink summary report received. Battery status OK. Normal device function. No new symptom episodes, tachy episodes, brady, or pause episodes. No new AF episodes. Monthly summary reports and ROV with GT in 12/2015. 

## 2015-09-21 ENCOUNTER — Ambulatory Visit (INDEPENDENT_AMBULATORY_CARE_PROVIDER_SITE_OTHER): Payer: Medicare Other | Admitting: *Deleted

## 2015-09-21 DIAGNOSIS — I639 Cerebral infarction, unspecified: Secondary | ICD-10-CM

## 2015-09-21 NOTE — Progress Notes (Signed)
Carelink Summary Report / Loop Recorder 

## 2015-10-20 ENCOUNTER — Ambulatory Visit (INDEPENDENT_AMBULATORY_CARE_PROVIDER_SITE_OTHER): Payer: Medicare Other | Admitting: *Deleted

## 2015-10-20 DIAGNOSIS — I639 Cerebral infarction, unspecified: Secondary | ICD-10-CM | POA: Diagnosis not present

## 2015-10-20 NOTE — Progress Notes (Signed)
Carelink Summary Report / Loop Recorder 

## 2015-10-27 DIAGNOSIS — M2042 Other hammer toe(s) (acquired), left foot: Secondary | ICD-10-CM | POA: Diagnosis not present

## 2015-10-27 DIAGNOSIS — M21612 Bunion of left foot: Secondary | ICD-10-CM | POA: Diagnosis not present

## 2015-10-27 DIAGNOSIS — M205X2 Other deformities of toe(s) (acquired), left foot: Secondary | ICD-10-CM | POA: Diagnosis not present

## 2015-10-27 DIAGNOSIS — M2012 Hallux valgus (acquired), left foot: Secondary | ICD-10-CM | POA: Diagnosis not present

## 2015-11-05 MED FILL — BUPROPION HCL XL 150 MG TAB: 150 | 90 days supply | Qty: 90 | Fill #2

## 2015-11-17 LAB — CUP PACEART REMOTE DEVICE CHECK: MDC IDC SESS DTM: 20161204120542

## 2015-11-18 MED FILL — CELECOXIB 200 MG CAPSULE: 200 | 90 days supply | Qty: 90 | Fill #1

## 2015-11-18 MED FILL — FLUoxetine HCL 40 MG CAPS: 40 | 90 days supply | Qty: 90 | Fill #1

## 2015-11-19 ENCOUNTER — Ambulatory Visit (INDEPENDENT_AMBULATORY_CARE_PROVIDER_SITE_OTHER): Payer: Medicare Other | Admitting: *Deleted

## 2015-11-19 DIAGNOSIS — I639 Cerebral infarction, unspecified: Secondary | ICD-10-CM | POA: Diagnosis not present

## 2015-11-19 NOTE — Progress Notes (Signed)
Carelink Summary Report / Loop Recorder 

## 2015-11-30 LAB — CUP PACEART REMOTE DEVICE CHECK: MDC IDC SESS DTM: 20170103120620

## 2015-11-30 NOTE — Progress Notes (Signed)
Carelink summary report received. Battery status OK. Normal device function. No new symptom episodes, tachy episodes, brady, or pause episodes. No new AF episodes. Monthly summary reports and ROV/PRN 

## 2015-12-21 ENCOUNTER — Ambulatory Visit (INDEPENDENT_AMBULATORY_CARE_PROVIDER_SITE_OTHER): Payer: Medicare Other | Admitting: *Deleted

## 2015-12-21 DIAGNOSIS — I639 Cerebral infarction, unspecified: Secondary | ICD-10-CM

## 2015-12-22 LAB — CUP PACEART REMOTE DEVICE CHECK: Date Time Interrogation Session: 20170202123608

## 2015-12-22 NOTE — Progress Notes (Signed)
Carelink summary report received. Battery status OK. Normal device function. No new symptom episodes, tachy episodes, brady, or pause episodes. No new AF episodes. Monthly summary reports and ROV/PRN 

## 2015-12-25 NOTE — Progress Notes (Signed)
Carelink Summary Report / Loop Recorder 

## 2015-12-26 LAB — CUP PACEART REMOTE DEVICE CHECK: Date Time Interrogation Session: 20170304130539

## 2015-12-26 NOTE — Progress Notes (Signed)
Carelink summary report received. Battery status OK. Normal device function. No new symptom episodes, tachy episodes, brady, or pause episodes. No new AF episodes. Monthly summary reports and ROV/PRN 

## 2016-01-18 ENCOUNTER — Ambulatory Visit (INDEPENDENT_AMBULATORY_CARE_PROVIDER_SITE_OTHER): Payer: Medicare Other | Admitting: *Deleted

## 2016-01-18 DIAGNOSIS — I639 Cerebral infarction, unspecified: Secondary | ICD-10-CM

## 2016-01-18 NOTE — Progress Notes (Signed)
Carelink Summary Report / Loop Recorder 

## 2016-02-05 ENCOUNTER — Telehealth: Payer: Self-pay | Admitting: Cardiology

## 2016-02-05 NOTE — Telephone Encounter (Signed)
Spoke w/ pt and requested that she send a manual transmission b/c her home monitor has not updated in at least 14 days.   

## 2016-02-12 ENCOUNTER — Telehealth: Payer: Self-pay | Admitting: Cardiology

## 2016-02-12 NOTE — Telephone Encounter (Signed)
LMOVM requesting that pt send manual transmission b/c home monitor has not updated in at least 14 days.    

## 2016-02-17 ENCOUNTER — Ambulatory Visit (INDEPENDENT_AMBULATORY_CARE_PROVIDER_SITE_OTHER): Payer: Medicare Other | Admitting: *Deleted

## 2016-02-17 DIAGNOSIS — I639 Cerebral infarction, unspecified: Secondary | ICD-10-CM | POA: Diagnosis not present

## 2016-02-17 LAB — CUP PACEART REMOTE DEVICE CHECK: Date Time Interrogation Session: 20170503140601

## 2016-02-18 NOTE — Progress Notes (Signed)
Carelink Summary Report / Loop Recorder 

## 2016-03-04 ENCOUNTER — Telehealth: Payer: Self-pay | Admitting: Cardiology

## 2016-03-04 NOTE — Telephone Encounter (Signed)
Pt is aware that her home monitor is not working and is going to bring it into the office today to get help w/ it.

## 2016-03-08 MED FILL — ALOSETRON HCL 0.5 MG TABLET: 0.5 | 30 days supply | Qty: 15 | Fill #1

## 2016-03-09 ENCOUNTER — Encounter: Payer: Self-pay | Admitting: Gastroenterology

## 2016-03-09 ENCOUNTER — Other Ambulatory Visit (INDEPENDENT_AMBULATORY_CARE_PROVIDER_SITE_OTHER): Payer: Medicare Other

## 2016-03-09 ENCOUNTER — Ambulatory Visit (INDEPENDENT_AMBULATORY_CARE_PROVIDER_SITE_OTHER): Payer: Medicare Other | Admitting: Gastroenterology

## 2016-03-09 VITALS — BP 98/60 | HR 68 | Ht 66.0 in | Wt 143.0 lb

## 2016-03-09 DIAGNOSIS — R197 Diarrhea, unspecified: Secondary | ICD-10-CM

## 2016-03-09 DIAGNOSIS — R1030 Lower abdominal pain, unspecified: Secondary | ICD-10-CM | POA: Diagnosis not present

## 2016-03-09 DIAGNOSIS — R634 Abnormal weight loss: Secondary | ICD-10-CM

## 2016-03-09 DIAGNOSIS — I639 Cerebral infarction, unspecified: Secondary | ICD-10-CM | POA: Diagnosis not present

## 2016-03-09 LAB — CBC WITH DIFFERENTIAL/PLATELET
BASOS ABS: 0.1 10*3/uL (ref 0.0–0.1)
Basophils Relative: 1.4 % (ref 0.0–3.0)
EOS ABS: 0.2 10*3/uL (ref 0.0–0.7)
Eosinophils Relative: 2.5 % (ref 0.0–5.0)
HCT: 38.6 % (ref 36.0–46.0)
Hemoglobin: 13.1 g/dL (ref 12.0–15.0)
LYMPHS ABS: 2.3 10*3/uL (ref 0.7–4.0)
LYMPHS PCT: 31.6 % (ref 12.0–46.0)
MCHC: 33.9 g/dL (ref 30.0–36.0)
MCV: 93.1 fl (ref 78.0–100.0)
Monocytes Absolute: 0.7 10*3/uL (ref 0.1–1.0)
Monocytes Relative: 10.3 % (ref 3.0–12.0)
NEUTROS ABS: 3.9 10*3/uL (ref 1.4–7.7)
NEUTROS PCT: 54.2 % (ref 43.0–77.0)
PLATELETS: 248 10*3/uL (ref 150.0–400.0)
RBC: 4.15 Mil/uL (ref 3.87–5.11)
RDW: 13 % (ref 11.5–15.5)
WBC: 7.2 10*3/uL (ref 4.0–10.5)

## 2016-03-09 LAB — COMPREHENSIVE METABOLIC PANEL
ALT: 13 U/L (ref 0–35)
AST: 21 U/L (ref 0–37)
Albumin: 4.1 g/dL (ref 3.5–5.2)
Alkaline Phosphatase: 55 U/L (ref 39–117)
BILIRUBIN TOTAL: 0.4 mg/dL (ref 0.2–1.2)
BUN: 16 mg/dL (ref 6–23)
CALCIUM: 9.4 mg/dL (ref 8.4–10.5)
CO2: 29 meq/L (ref 19–32)
CREATININE: 0.9 mg/dL (ref 0.40–1.20)
Chloride: 106 mEq/L (ref 96–112)
GFR: 65.91 mL/min (ref >60.00)
GLUCOSE: 77 mg/dL (ref 70–99)
Potassium: 4.2 mEq/L (ref 3.5–5.1)
Sodium: 141 mEq/L (ref 135–145)
TOTAL PROTEIN: 6.6 g/dL (ref 6.0–8.3)

## 2016-03-09 LAB — TSH: TSH: 1.41 u[IU]/mL (ref 0.35–4.50)

## 2016-03-09 LAB — IGA: IgA: 302 mg/dL (ref 68–378)

## 2016-03-09 MED ORDER — ALOSETRON HCL 0.5 MG PO TABS: 0.5000 mg | ORAL_TABLET | Freq: Every morning | ORAL | Status: DC

## 2016-03-09 MED ORDER — HYOSCYAMINE SULFATE 0.125 MG SL SUBL: SUBLINGUAL_TABLET | SUBLINGUAL | Status: DC

## 2016-03-09 NOTE — Patient Instructions (Addendum)
We have sent the following medications to your pharmacy for you to pick up at your convenience:Lotronex and Levsin.  Your physician has requested that you go to the basement for  lab work before leaving today.  It has been recommended to you by your physician that you have a(n) Colonoscopy completed. Per your request, we did not schedule the procedure(s) today. Please contact our office at [phone] should you decide to have the procedure completed.   Thank you for choosing me and [facility].  Pricilla Riffle. Dagoberto Ligas., MD., Marval Regal

## 2016-03-09 NOTE — Progress Notes (Signed)
    History of Present Illness: This is a 70 year old female referred by Angelica Baton, MD for the evaluation of diarrhea. She is accompanied by her husband, Dr. Verl Blalock. She has a long history of diarrhea felt secondary to lactose intolerance, sugar free gum intolerance, intolerance to several other foods and IBS. She underwent evaluation for diarrhea in 2012 including colonoscopy with biopsies, stool studies and a celiac panel. Her diarrhea has been well controlled with dietary restrictions, Imodium as needed and Lotronex as needed for several years. Over the past few months her diarrhea has increased in frequency and severity. She has had a few episodes of urgency with fecal incontinence. She has also had nocturnal diarrhea on several occasions. She reports a 10-15 pound weight loss gradually over the past 6 months and she feels this is due to eating less with her diet limitations. Over the past several weeks she has also had an intermittent cramping, heavy feeling in her lower abdomen. She denies any recent medication changes, any travel history or any antibiotic usage in the past several months. Denies weight loss, constipation, change in stool caliber, melena, hematochezia, nausea, vomiting, dysphagia, reflux symptoms, chest pain.  Review of Systems: Pertinent positive and negative review of systems were noted in the above HPI section. All other review of systems were otherwise negative.  Current Medications, Allergies, Past Medical History, Past Surgical History, Family History and Social History were reviewed in [employer] record.  Physical Exam: General: Well developed, well nourished, no acute distress Head: Normocephalic and atraumatic Eyes:  sclerae anicteric, EOMI Ears: Normal auditory acuity Mouth: No deformity or lesions Neck: Supple, no masses or thyromegaly Lungs: Clear throughout to auscultation Heart: Regular rate and rhythm; no murmurs, rubs or  bruits Abdomen: Soft, mild lower abdominal tenderness and non distended. No masses, hepatosplenomegaly or hernias noted. Normal Bowel sounds Rectal: deferred to colonoscopy Musculoskeletal: Symmetrical with no gross deformities  Skin: No lesions on visible extremities Pulses:  Normal pulses noted Extremities: No clubbing, cyanosis, edema or deformities noted Neurological: Alert oriented x 4, grossly nonfocal Cervical Nodes:  No significant cervical adenopathy Inguinal Nodes: No significant inguinal adenopathy Psychological:  Alert and cooperative. Normal mood and affect  Assessment and Recommendations:  1. Worsening of chronic diarrhea associated with a few episodes of fecal incontinence and a few episodes of nocturnal diarrhea. Mild weight loss, which could be related to decreased intake with dietary restrictions. Mild crampy lower abdominal pain. Lactose and other food intolerances. History of IBS. Rule out a new etiology of diarrhea. Obtain CBC, CMP, TSH, tTG, IgA, GI pathogen panel, fecal elastase and fecal lactoferrin. Continue Imodium twice daily as needed. Continue Lotronex 0.5 mg daily as needed. Begin hyoscyamine 0.125 mg 1-2 every 4 hours as needed for abdominal pain and cramping. Consider a trial of Xifaxan for possible SIBO. Schedule colonoscopy to evaluate for microscopic colitis and other etiologies of diarrhea. The risks (including bleeding, perforation, infection, missed lesions, medication reactions and possible hospitalization or surgery if complications occur), benefits, and alternatives to colonoscopy with possible biopsy and possible polypectomy were discussed with the patient and they consent to proceed. Consider abdominal/pelvic CT if above evaluation is negative.  2. GERD. Continue Nexium 40 mg daily as needed and standard antireflux measures.  cc: Angelica Baton, MD [address] [place], [place] [address]

## 2016-03-10 ENCOUNTER — Telehealth: Payer: Self-pay | Admitting: Gastroenterology

## 2016-03-10 LAB — TISSUE TRANSGLUTAMINASE, IGA: Tissue Transglutaminase Ab, IgA: 1 U/mL (ref <4)

## 2016-03-10 MED ORDER — DICYCLOMINE HCL 10 MG PO CAPS: 10.0000 mg | ORAL_CAPSULE | Freq: Four times a day (QID) | ORAL | Status: DC | PRN

## 2016-03-10 NOTE — Telephone Encounter (Signed)
Dicyclomine 10 mg po qid prn abd pain, 1 year of refills

## 2016-03-10 NOTE — Telephone Encounter (Signed)
Levsin was sent to the pharmacy yesterday and it is too expensive. Can we send generic Bentyl instead?

## 2016-03-10 NOTE — Telephone Encounter (Signed)
Prescription sent to [facility] outpatient pharmacy.

## 2016-03-12 LAB — CUP PACEART REMOTE DEVICE CHECK: MDC IDC SESS DTM: 20170403133533

## 2016-03-12 NOTE — Progress Notes (Signed)
Carelink summary report received. Battery status OK. Normal device function. No new symptom episodes, tachy episodes, brady, or pause episodes. No new AF episodes. Monthly summary reports and ROV/PRN 

## 2016-03-15 ENCOUNTER — Encounter: Payer: Self-pay | Admitting: Internal Medicine

## 2016-03-17 MED FILL — CELECOXIB 200 MG CAPSULE: 200 | 90 days supply | Qty: 90 | Fill #2

## 2016-03-17 MED FILL — FLUoxetine HCL 40 MG CAPS: 40 | 90 days supply | Qty: 90 | Fill #2

## 2016-03-18 ENCOUNTER — Ambulatory Visit (INDEPENDENT_AMBULATORY_CARE_PROVIDER_SITE_OTHER): Payer: Medicare Other | Admitting: *Deleted

## 2016-03-18 DIAGNOSIS — I639 Cerebral infarction, unspecified: Secondary | ICD-10-CM | POA: Diagnosis not present

## 2016-03-18 NOTE — Progress Notes (Signed)
Carelink Summary Report / Loop Recorder 

## 2016-03-21 MED FILL — DICYCLOMINE 10 MG CAPSULE: 10 | 30 days supply | Qty: 120 | Fill #0

## 2016-03-23 ENCOUNTER — Telehealth: Payer: Self-pay

## 2016-03-23 MED ORDER — COLESTIPOL HCL 1 G PO TABS: 2.0000 g | ORAL_TABLET | Freq: Two times a day (BID) | ORAL | Status: DC

## 2016-03-23 MED FILL — COLESTIPOL HCL 1 GM TABLET: 1 | 30 days supply | Qty: 120 | Fill #0

## 2016-03-23 NOTE — Telephone Encounter (Signed)
Message  Received: Today    Ladene Artist, MD  Marlon Pel, RN           Received a phone message from Dr. Sharlett Iles. Angelica Guerrero is still having diarrhea. Please contact her or Dr. Sharlett Iles and try Colestid 2 po bid. Can use questran instead at 2g bid if Colestid not covered.        Patient notified rx sent

## 2016-03-24 ENCOUNTER — Telehealth: Payer: Self-pay | Admitting: *Deleted

## 2016-03-24 NOTE — Telephone Encounter (Signed)
Called to advise patient that her LINQ has reached RRT.  Patient verbalizes understanding.  She would like to schedule and appointment with Dr. Lovena Le to discuss extraction, but is unsure of when she will be available for an appointment.  Patient agrees to call our office when she is ready to schedule an appointment.  She is appreciative of call and denies additional questions or concerns at this time.

## 2016-03-27 NOTE — Progress Notes (Signed)
Carelink summary report received. Battery status OK. Normal device function. No new symptom episodes, tachy episodes, brady, or pause episodes. No new AF episodes. Monthly summary reports and ROV/PRN 

## 2016-04-18 ENCOUNTER — Ambulatory Visit (INDEPENDENT_AMBULATORY_CARE_PROVIDER_SITE_OTHER): Payer: Medicare Other | Admitting: *Deleted

## 2016-04-18 DIAGNOSIS — I639 Cerebral infarction, unspecified: Secondary | ICD-10-CM | POA: Diagnosis not present

## 2016-04-18 NOTE — Progress Notes (Signed)
Carelink Summary Report / Loop Recorder 

## 2016-04-25 DIAGNOSIS — F329 Major depressive disorder, single episode, unspecified: Secondary | ICD-10-CM | POA: Diagnosis not present

## 2016-04-25 DIAGNOSIS — R413 Other amnesia: Secondary | ICD-10-CM | POA: Diagnosis not present

## 2016-04-25 DIAGNOSIS — E559 Vitamin D deficiency, unspecified: Secondary | ICD-10-CM | POA: Diagnosis not present

## 2016-04-25 DIAGNOSIS — R41844 Frontal lobe and executive function deficit: Secondary | ICD-10-CM | POA: Diagnosis not present

## 2016-04-25 DIAGNOSIS — G459 Transient cerebral ischemic attack, unspecified: Secondary | ICD-10-CM | POA: Diagnosis not present

## 2016-04-25 DIAGNOSIS — G3184 Mild cognitive impairment, so stated: Secondary | ICD-10-CM | POA: Diagnosis not present

## 2016-04-26 ENCOUNTER — Other Ambulatory Visit (HOSPITAL_COMMUNITY): Payer: Self-pay | Admitting: Thoracic Surgery

## 2016-04-26 ENCOUNTER — Telehealth: Payer: Self-pay | Admitting: Radiology

## 2016-04-26 DIAGNOSIS — R413 Other amnesia: Secondary | ICD-10-CM

## 2016-04-26 DIAGNOSIS — F070 Personality change due to known physiological condition: Secondary | ICD-10-CM

## 2016-04-27 LAB — CUP PACEART REMOTE DEVICE CHECK: MDC IDC SESS DTM: 20170602143640

## 2016-05-04 ENCOUNTER — Ambulatory Visit (HOSPITAL_COMMUNITY)
Admission: RE | Admit: 2016-05-04 | Discharge: 2016-05-04 | Disposition: A | Discharge status: Home / Self Care | Payer: Medicare Other | Source: Ambulatory Visit | Attending: Thoracic Surgery | Admitting: Thoracic Surgery

## 2016-05-04 DIAGNOSIS — R413 Other amnesia: Secondary | ICD-10-CM

## 2016-05-04 DIAGNOSIS — F070 Personality change due to known physiological condition: Secondary | ICD-10-CM | POA: Diagnosis not present

## 2016-05-04 MED ORDER — FLUDEOXYGLUCOSE F - 18 (FDG) INJECTION
10.2500 | Freq: Once | INTRAVENOUS | Status: AC | PRN
  Administered 2016-05-04: 10.25 via INTRAVENOUS

## 2016-05-05 ENCOUNTER — Ambulatory Visit (HOSPITAL_COMMUNITY): Payer: Medicare Other

## 2016-05-06 LAB — CUP PACEART REMOTE DEVICE CHECK: MDC IDC SESS DTM: 20170702150700

## 2016-05-17 ENCOUNTER — Encounter: Payer: Medicare Other | Admitting: *Deleted

## 2016-07-05 DIAGNOSIS — R41844 Frontal lobe and executive function deficit: Secondary | ICD-10-CM | POA: Diagnosis not present

## 2016-07-05 DIAGNOSIS — G459 Transient cerebral ischemic attack, unspecified: Secondary | ICD-10-CM | POA: Diagnosis not present

## 2016-07-05 DIAGNOSIS — G3184 Mild cognitive impairment, so stated: Secondary | ICD-10-CM | POA: Diagnosis not present

## 2016-07-05 DIAGNOSIS — R41842 Visuospatial deficit: Secondary | ICD-10-CM | POA: Diagnosis not present

## 2016-07-07 MED FILL — EXELON 4.6 MG/24HR PATCH: 4.6 | 30 days supply | Qty: 30 | Fill #0

## 2016-07-14 DIAGNOSIS — R8299 Other abnormal findings in urine: Secondary | ICD-10-CM | POA: Diagnosis not present

## 2016-07-14 DIAGNOSIS — E784 Other hyperlipidemia: Secondary | ICD-10-CM | POA: Diagnosis not present

## 2016-07-14 DIAGNOSIS — N390 Urinary tract infection, site not specified: Secondary | ICD-10-CM | POA: Diagnosis not present

## 2016-07-14 DIAGNOSIS — R7301 Impaired fasting glucose: Secondary | ICD-10-CM | POA: Diagnosis not present

## 2016-08-08 ENCOUNTER — Other Ambulatory Visit: Payer: Self-pay | Admitting: Internal Medicine

## 2016-08-08 DIAGNOSIS — Z1231 Encounter for screening mammogram for malignant neoplasm of breast: Secondary | ICD-10-CM

## 2016-08-12 MED FILL — BUPROPION HCL XL 150 MG TAB: 150 | 90 days supply | Qty: 90 | Fill #0

## 2016-08-18 MED FILL — EXELON 9.5 MG/24HR PATCH: 9.5 | 30 days supply | Qty: 30 | Fill #0

## 2016-08-29 ENCOUNTER — Ambulatory Visit: Payer: Medicare Other

## 2016-09-01 DIAGNOSIS — F418 Other specified anxiety disorders: Secondary | ICD-10-CM | POA: Diagnosis not present

## 2016-09-01 DIAGNOSIS — R32 Unspecified urinary incontinence: Secondary | ICD-10-CM | POA: Diagnosis not present

## 2016-09-01 DIAGNOSIS — E784 Other hyperlipidemia: Secondary | ICD-10-CM | POA: Diagnosis not present

## 2016-09-01 DIAGNOSIS — Z0000 Encounter for general adult medical examination without abnormal findings: Secondary | ICD-10-CM | POA: Diagnosis not present

## 2016-09-01 DIAGNOSIS — Z1389 Encounter for screening for other disorder: Secondary | ICD-10-CM | POA: Diagnosis not present

## 2016-09-01 DIAGNOSIS — Z23 Encounter for immunization: Secondary | ICD-10-CM | POA: Diagnosis not present

## 2016-09-01 DIAGNOSIS — F325 Major depressive disorder, single episode, in full remission: Secondary | ICD-10-CM | POA: Diagnosis not present

## 2016-09-01 DIAGNOSIS — G4709 Other insomnia: Secondary | ICD-10-CM | POA: Diagnosis not present

## 2016-09-01 DIAGNOSIS — Z6825 Body mass index (BMI) 25.0-25.9, adult: Secondary | ICD-10-CM | POA: Diagnosis not present

## 2016-09-01 DIAGNOSIS — G3184 Mild cognitive impairment, so stated: Secondary | ICD-10-CM | POA: Diagnosis not present

## 2016-09-01 DIAGNOSIS — G4733 Obstructive sleep apnea (adult) (pediatric): Secondary | ICD-10-CM | POA: Diagnosis not present

## 2016-09-01 DIAGNOSIS — R7301 Impaired fasting glucose: Secondary | ICD-10-CM | POA: Diagnosis not present

## 2016-09-14 MED FILL — CELECOXIB 200 MG CAPSULE: 200 | 90 days supply | Qty: 90 | Fill #0

## 2016-09-14 MED FILL — FLUoxetine HCL 40 MG CAPS: 40 | 90 days supply | Qty: 90 | Fill #0

## 2016-09-14 MED FILL — ROSUVASTATIN CALCIUM 40 MG: 40 | 90 days supply | Qty: 90 | Fill #0

## 2016-10-03 DIAGNOSIS — M1712 Unilateral primary osteoarthritis, left knee: Secondary | ICD-10-CM | POA: Diagnosis not present

## 2016-10-03 DIAGNOSIS — M25562 Pain in left knee: Secondary | ICD-10-CM | POA: Diagnosis not present

## 2016-10-03 MED FILL — EXELON 9.5 MG/24HR PATCH: 9.5 | 30 days supply | Qty: 30 | Fill #0

## 2016-10-05 ENCOUNTER — Ambulatory Visit: Payer: Medicare Other

## 2016-10-05 DIAGNOSIS — L239 Allergic contact dermatitis, unspecified cause: Secondary | ICD-10-CM | POA: Diagnosis not present

## 2016-10-05 MED FILL — TRIAMCINOLONE 0.1% CREAM: 0.1 | 14 days supply | Qty: 80 | Fill #0

## 2016-10-05 MED FILL — BETAMETHASONE DP AUG 0.05%: 0.05 | 10 days supply | Qty: 15 | Fill #0

## 2016-10-12 MED FILL — DONEPEZIL HCL 5 MG TABLET: 5 | 30 days supply | Qty: 30 | Fill #0

## 2016-10-17 HISTORY — PX: COLONOSCOPY: SHX174

## 2016-10-24 ENCOUNTER — Ambulatory Visit: Payer: Medicare Other

## 2016-10-26 DIAGNOSIS — R41842 Visuospatial deficit: Secondary | ICD-10-CM | POA: Diagnosis not present

## 2016-10-26 DIAGNOSIS — G3184 Mild cognitive impairment, so stated: Secondary | ICD-10-CM | POA: Diagnosis not present

## 2016-10-26 DIAGNOSIS — F329 Major depressive disorder, single episode, unspecified: Secondary | ICD-10-CM | POA: Diagnosis not present

## 2016-10-26 DIAGNOSIS — G459 Transient cerebral ischemic attack, unspecified: Secondary | ICD-10-CM | POA: Diagnosis not present

## 2016-10-27 DIAGNOSIS — Z1231 Encounter for screening mammogram for malignant neoplasm of breast: Secondary | ICD-10-CM | POA: Diagnosis not present

## 2016-10-28 DIAGNOSIS — Z471 Aftercare following joint replacement surgery: Secondary | ICD-10-CM | POA: Diagnosis not present

## 2016-10-28 DIAGNOSIS — M1712 Unilateral primary osteoarthritis, left knee: Secondary | ICD-10-CM | POA: Diagnosis not present

## 2016-10-28 DIAGNOSIS — Z96651 Presence of right artificial knee joint: Secondary | ICD-10-CM | POA: Diagnosis not present

## 2016-11-02 NOTE — Progress Notes (Signed)
Scheduling pre op- please place surgical orders in epic  Thanks drew

## 2016-11-03 NOTE — Progress Notes (Signed)
Please place orders in EPIC as patient is being scheduled for a Pre-op appointment! Thank you! 

## 2016-11-04 DIAGNOSIS — M1712 Unilateral primary osteoarthritis, left knee: Secondary | ICD-10-CM | POA: Diagnosis not present

## 2016-11-04 NOTE — Progress Notes (Signed)
Called patient to inform patient that she needed a device check on her Loop Recorder before surgery as Anesthesia requests this. Patient states" I do not need it any longer ". I informed patient that she needs to contact her Cardiologist to get clearance from him that she is OK to have surgery without having device checked within last 6 months. Patient verbalized understanding! Called Orson Slick at Dr. Wynelle Link office and left a message for her that patient needs a clearance from Cardiologist concerning this.

## 2016-11-07 DIAGNOSIS — Z780 Asymptomatic menopausal state: Secondary | ICD-10-CM | POA: Diagnosis not present

## 2016-11-07 DIAGNOSIS — Z0000 Encounter for general adult medical examination without abnormal findings: Secondary | ICD-10-CM | POA: Diagnosis not present

## 2016-11-08 ENCOUNTER — Ambulatory Visit: Payer: Self-pay | Admitting: Orthopedic Surgery

## 2016-11-10 ENCOUNTER — Ambulatory Visit: Payer: Self-pay | Admitting: Orthopedic Surgery

## 2016-11-10 NOTE — H&P (Signed)
Angelica Guerrero DOB: 12-16-1945 Married / Language: Cleophus Molt / Race: White Female Date of Admission:  11/28/2016 CC:  Left Knee Pain History of Present Illness The patient is a 71 year old female who comes in for a preoperative History and Physical. The patient is scheduled for a left total knee arthroplasty to be performed by Dr. Dione Plover. Aluisio, MD at [facility] on 11-28-2016. The patient is a 71 year old female who presented with knee complaints. The patient was seen in referral from [place]. The patient reports left knee symptoms including: pain, swelling, locking, giving way, soreness and grinding (popping) which began 1 month(s) ago in association with an established activity (dancing). The patient describes their pain as sharp.The patient feels that the symptoms are worsening. The patient has the current diagnosis of knee osteoarthritis. Past treatment for this problem has included intra-articular injection of corticosteroids (cortisone injection s/p 3 weeks: did not help). Symptoms are reported to be located in the left knee and left posterior knee and include knee pain and difficulty bearing weight. Symptoms are relieved by rest. Current treatment includes application of ice, use of a walker and nonsteroidal anti-inflammatory drugs. Note for "Knee pain": right TKA 2009: doing good. Left knee scope s/p 3 years ago. Mopsy's left knee has become significantly problematic for her. For many months now she has had progressively worsening pain and dysfunction in the knee. For the past month it has gotten rather intense and hurts with every step. She even have pain at rest and at night. It is really limiting what she can and cannot do. It is as bad or worse in her right knee was already replaced that about eight years ago. She is now ready to get the left knee replaced. They have been treated conservatively in the past for the above stated problem and despite conservative measures, they  continue to have progressive pain and severe functional limitations and dysfunction. They have failed non-operative management including home exercise, medications, and injections. It is felt that they would benefit from undergoing total joint replacement. Risks and benefits of the procedure have been discussed with the patient and they elect to proceed with surgery. There are no active contraindications to surgery such as ongoing infection or rapidly progressive neurological disease.  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 mother Cancer  mother and grandfather fathers side  Social History Marital status  married Living situation  live with spouse Illicit drug use  no Number of flights of stairs before winded  greater than 5 Tobacco / smoke exposure  no Pain Contract  no Current work status  retired Children  3 Alcohol use  current drinker; drinks wine; less than 5 per week Drug/Alcohol Rehab (Currently)  no Former smoker  Exercise  Exercises daily; does running / walking Drug/Alcohol Rehab (Previously)  no Tobacco use  Former smoker. former smoker; smoke(d) less than  1/2 pack(s) per day  Medication History  Aspirin EC Low Strength (81MG  Tablet DR, Oral)  Active. CeleBREX (200MG  Capsule, Oral) Active. BuPROPion HCl ER (XL) (150MG  Tablet ER 24HR, Oral) Active. Rosuvastatin Calcium (40MG  Tablet, Oral) Active. Aricept (5MG  Tablet, Oral) Active. FLUoxetine HCl (Oral) Specific strength unknown - Active. Lotrinex Active. Immodium Active. Nicotine Gum Active. Tylenol PM Active.   Past Surgical History  Anal Fissure Repair  Hemorrhoidectomy  Hysterectomy  complete (cancerous) Total Knee Replacement  right Tubal Ligation  Bunion Surgery - Bilateral    Review of Systems General Not Present- Chills, Fatigue, Fever, Memory Loss, Night Sweats, Weight Gain and Weight Loss. Skin Not Present- Eczema, Hives, Itching, Lesions and Rash. HEENT Not Present- Dentures, Double Vision, Headache, Hearing Loss, Tinnitus and Visual Loss. Respiratory Not Present- Allergies, Chronic Cough, Coughing up blood, Shortness of breath at rest and Shortness of breath with exertion. Note: recent cold but has resolved Cardiovascular Not Present- Chest Pain, Difficulty Breathing Lying Down, Murmur, Palpitations, Racing/skipping heartbeats and Swelling. Gastrointestinal Present- Diarrhea (IBS). Not Present- Abdominal Pain, Bloody Stool, Constipation, Difficulty Swallowing, Heartburn, Jaundice, Loss of appetitie, Nausea and Vomiting. Female Genitourinary Not Present- Blood in Urine, Discharge, Flank Pain, Incontinence, Painful Urination, Urgency, Urinary frequency, Urinary Retention, Urinating at Night and Weak urinary stream. Musculoskeletal Not Present- Back Pain, Joint Pain, Joint Swelling, Morning Stiffness, Muscle Pain, Muscle Weakness and Spasms. Neurological Not Present- Blackout spells, Difficulty with balance, Dizziness, Paralysis, Tremor and Weakness. Psychiatric Not Present- Insomnia.  Vitals Weight: 144 lb Height: 65in Weight was reported by patient. Height was reported by patient. Body Surface Area: 1.72 m Body Mass Index: 23.96 kg/m   Pulse: 68 (Regular)  BP: 128/68 (Sitting, Right Arm, Standard)  Physical Exam General Mental Status -Alert, cooperative and good historian. General Appearance-pleasant, Not in acute distress. Orientation-Oriented X3. Build & Nutrition-Well nourished and Well developed.  Head and Neck Head-normocephalic, atraumatic . Neck Global Assessment - supple, no bruit auscultated on the right, no bruit auscultated on the left.  Eye Vision-Wears corrective lenses. Pupil - Bilateral-Regular and Round. Motion - Bilateral-EOMI.  Chest and Lung Exam Auscultation Breath sounds - clear at anterior chest wall and clear at posterior chest wall. Adventitious sounds - No Adventitious sounds.  Cardiovascular Auscultation Rhythm - Regular rate and rhythm. Heart Sounds - S1 WNL and S2 WNL. Murmurs & Other Heart Sounds - Auscultation of the heart reveals - No Murmurs.  Abdomen Palpation/Percussion Tenderness - Abdomen is non-tender to palpation. Rigidity (guarding) - Abdomen is soft. Auscultation Auscultation of the abdomen reveals - Bowel sounds normal.  Female Genitourinary Note: Not done, not pertinent to present illness   Musculoskeletal Note: On exam, well-developed female, alert, oriented, in no apparent distress. Evaluation of her right knee shows no swelling. Range of motion on the right is 0 to 125. There is no tenderness or instability. Her left knee, varus deformity, range 5 to 125, moderate crepitus on range of motion with tenderness medial greater than lateral, no instability noted. She has significantly antalgic gait pattern on the left.  RADIOGRAPHS AP both knees, lateral of the left showed that she has bone-on-bone arthritis in the medial and patellofemoral compartments of the left knee with large osteophyte formation and tibial subluxation. We also obtained x-ray of the right knee today in followup for her joint replacement and her prosthesis is in excellent  position with no periprosthetic abnormalities.  Assessment & Plan Primary osteoarthritis of left knee (M17.12)  Note:Surgical Plans: Left Total Knee Replacement  Disposition: Home  and straight to outpatient therapy.   Prescription for Physical Therapy given.  PCP: Dr. Virgina Jock  IV TXA  Anesthesia Issues: None  Patient was instructed on what medications to stop prior to surgery.  Signed electronically by Ok Edwards, III PA-C

## 2016-11-17 ENCOUNTER — Encounter (HOSPITAL_COMMUNITY): Payer: Self-pay

## 2016-11-17 NOTE — Progress Notes (Signed)
Left message with Angelica Guerrero of Dr Wynelle Link office requesting that cardiac clearance due to patients loop recorder implant or and/or to set up an appointment to have the patients device checked prior to surgery

## 2016-11-18 ENCOUNTER — Other Ambulatory Visit (HOSPITAL_COMMUNITY): Payer: Self-pay | Admitting: Emergency Medicine

## 2016-11-18 MED FILL — DONEPEZIL HCL 5 MG TABLET: 5 | 30 days supply | Qty: 30 | Fill #0

## 2016-11-18 NOTE — Patient Instructions (Signed)
Angelica Guerrero  11/18/2016   Your procedure is scheduled on: 11-28-16  Report to [facility] Main  Entrance take [facility]  elevators to 3rd floor to  [place] at 5:15AM.  Call this number if you have problems the morning of surgery [phone]   Remember: ONLY 1 PERSON MAY GO WITH YOU TO SHORT STAY TO GET  READY MORNING OF [place].  Do not eat food or drink liquids :After Midnight.     Take these medicines the morning of surgery with A SIP OF WATER: rosuvastatin(Prozac), Fluoxetine(Prozac)                                You may not have any metal on your body including hair pins and              piercings  Do not wear jewelry, make-up, lotions, powders or perfumes, deodorant             Do not wear nail polish.  Do not shave  48 hours prior to surgery.              Men may shave face and neck.   Bring CPAP mask and tubing. Device will be furnished for you.   Do not bring valuables to the hospital. [place].  Contacts, dentures or bridgework may not be worn into surgery.  Leave suitcase in the car. After surgery it may be brought to your room.              Please read over the following fact sheets you were given: _____________________________________________________________________             [facility] - Preparing for Surgery Before surgery, you can play an important role.  Because skin is not sterile, your skin needs to be as free of germs as possible.  You can reduce the number of germs on your skin by washing with CHG (chlorahexidine gluconate) soap before surgery.  CHG is an antiseptic cleaner which kills germs and bonds with the skin to continue killing germs even after washing. Please DO NOT use if you have an allergy to CHG or antibacterial soaps.  If your skin becomes reddened/irritated stop using the CHG and inform your nurse when you arrive at Short Stay. Do not shave (including  legs and underarms) for at least 48 hours prior to the first CHG shower.  You may shave your face/neck. Please follow these instructions carefully:  1.  Shower with CHG Soap the night before surgery and the  morning of Surgery.  2.  If you choose to wash your hair, wash your hair first as usual with your  normal  shampoo.  3.  After you shampoo, rinse your hair and body thoroughly to remove the  shampoo.                           4.  Use CHG as you would any other liquid soap.  You can apply chg directly  to the skin and wash                       Gently with a scrungie or  clean washcloth.  5.  Apply the CHG Soap to your body ONLY FROM THE NECK DOWN.   Do not use on face/ open                           Wound or open sores. Avoid contact with eyes, ears mouth and genitals (private parts).                       Wash face,  Genitals (private parts) with your normal soap.             6.  Wash thoroughly, paying special attention to the area where your surgery  will be performed.  7.  Thoroughly rinse your body with warm water from the neck down.  8.  DO NOT shower/wash with your normal soap after using and rinsing off  the CHG Soap.                9.  Pat yourself dry with a clean towel.            10.  Wear clean pajamas.            11.  Place clean sheets on your bed the night of your first shower and do not  sleep with pets. Day of Surgery : Do not apply any lotions/deodorants the morning of surgery.  Please wear clean clothes to the hospital/surgery center.  FAILURE TO FOLLOW THESE INSTRUCTIONS MAY RESULT IN THE CANCELLATION OF YOUR SURGERY PATIENT SIGNATURE_________________________________  NURSE SIGNATURE__________________________________  ________________________________________________________________________   Adam Phenix  An incentive spirometer is a tool that can help keep your lungs clear and active. This tool measures how well you are filling your lungs with each breath.  Taking long deep breaths may help reverse or decrease the chance of developing breathing (pulmonary) problems (especially infection) following:  A long period of time when you are unable to move or be active. BEFORE THE PROCEDURE   If the spirometer includes an indicator to show your best effort, your nurse or respiratory therapist will set it to a desired goal.  If possible, sit up straight or lean slightly forward. Try not to slouch.  Hold the incentive spirometer in an upright position. INSTRUCTIONS FOR USE  1. Sit on the edge of your bed if possible, or sit up as far as you can in bed or on a chair. 2. Hold the incentive spirometer in an upright position. 3. Breathe out normally. 4. Place the mouthpiece in your mouth and seal your lips tightly around it. 5. Breathe in slowly and as deeply as possible, raising the piston or the ball toward the top of the column. 6. Hold your breath for 3-5 seconds or for as long as possible. Allow the piston or ball to fall to the bottom of the column. 7. Remove the mouthpiece from your mouth and breathe out normally. 8. Rest for a few seconds and repeat Steps 1 through 7 at least 10 times every 1-2 hours when you are awake. Take your time and take a few normal breaths between deep breaths. 9. The spirometer may include an indicator to show your best effort. Use the indicator as a goal to work toward during each repetition. 10. After each set of 10 deep breaths, practice coughing to be sure your lungs are clear. If you have an incision (the cut made at the time of surgery), support your incision when coughing  by placing a pillow or rolled up towels firmly against it. Once you are able to get out of bed, walk around indoors and cough well. You may stop using the incentive spirometer when instructed by your caregiver.  RISKS AND COMPLICATIONS  Take your time so you do not get dizzy or light-headed.  If you are in pain, you may need to take or ask for pain  medication before doing incentive spirometry. It is harder to take a deep breath if you are having pain. AFTER USE  Rest and breathe slowly and easily.  It can be helpful to keep track of a log of your progress. Your caregiver can provide you with a simple table to help with this. If you are using the spirometer at home, follow these instructions: [place] IF:   You are having difficultly using the spirometer.  You have trouble using the spirometer as often as instructed.  Your pain medication is not giving enough relief while using the spirometer.  You develop fever of 100.5 F (38.1 C) or higher. SEEK IMMEDIATE MEDICAL CARE IF:   You cough up bloody sputum that had not been present before.  You develop fever of 102 F (38.9 C) or greater.  You develop worsening pain at or near the incision site. MAKE SURE YOU:   Understand these instructions.  Will watch your condition.  Will get help right away if you are not doing well or get worse. Document Released: 02/13/2007 Document Revised: 12/26/2011 Document Reviewed: 04/16/2007 ExitCare Patient Information 2014 ExitCare, [place].   ________________________________________________________________________  WHAT IS A BLOOD TRANSFUSION? Blood Transfusion Information  A transfusion is the replacement of blood or some of its parts. Blood is made up of multiple cells which provide different functions.  Red blood cells carry oxygen and are used for blood loss replacement.  White blood cells fight against infection.  Platelets control bleeding.  Plasma helps clot blood.  Other blood products are available for specialized needs, such as hemophilia or other clotting disorders. BEFORE THE TRANSFUSION  Who gives blood for transfusions?   Healthy volunteers who are fully evaluated to make sure their blood is safe. This is blood bank blood. Transfusion therapy is the safest it has ever been in the practice of medicine.  Before blood is taken from a donor, a complete history is taken to make sure that person has no history of diseases nor engages in risky social behavior (examples are intravenous drug use or sexual activity with multiple partners). The donor's travel history is screened to minimize risk of transmitting infections, such as malaria. The donated blood is tested for signs of infectious diseases, such as HIV and hepatitis. The blood is then tested to be sure it is compatible with you in order to minimize the chance of a transfusion reaction. If you or a relative donates blood, this is often done in anticipation of surgery and is not appropriate for emergency situations. It takes many days to process the donated blood. RISKS AND COMPLICATIONS Although transfusion therapy is very safe and saves many lives, the main dangers of transfusion include:   Getting an infectious disease.  Developing a transfusion reaction. This is an allergic reaction to something in the blood you were given. Every precaution is taken to prevent this. The decision to have a blood transfusion has been considered carefully by your caregiver before blood is given. Blood is not given unless the benefits outweigh the risks. AFTER THE TRANSFUSION  Right after receiving a  blood transfusion, you will usually feel much better and more energetic. This is especially true if your red blood cells have gotten low (anemic). The transfusion raises the level of the red blood cells which carry oxygen, and this usually causes an energy increase.  The nurse administering the transfusion will monitor you carefully for complications. HOME CARE INSTRUCTIONS  No special instructions are needed after a transfusion. You may find your energy is better. Speak with your caregiver about any limitations on activity for underlying diseases you may have. SEEK MEDICAL CARE IF:   Your condition is not improving after your transfusion.  You develop redness or  irritation at the intravenous (IV) site. SEEK IMMEDIATE MEDICAL CARE IF:  Any of the following symptoms occur over the next 12 hours:  Shaking chills.  You have a temperature by mouth above 102 F (38.9 C), not controlled by medicine.  Chest, back, or muscle pain.  People around you feel you are not acting correctly or are confused.  Shortness of breath or difficulty breathing.  Dizziness and fainting.  You get a rash or develop hives.  You have a decrease in urine output.  Your urine turns a dark color or changes to pink, red, or brown. Any of the following symptoms occur over the next 10 days:  You have a temperature by mouth above 102 F (38.9 C), not controlled by medicine.  Shortness of breath.  Weakness after normal activity.  The white part of the eye turns yellow (jaundice).  You have a decrease in the amount of urine or are urinating less often.  Your urine turns a dark color or changes to pink, red, or brown. Document Released: 09/30/2000 Document Revised: 12/26/2011 Document Reviewed: 05/19/2008 [facility] Patient Information 2014 [place], [place].  _______________________________________________________________________

## 2016-11-18 NOTE — Progress Notes (Signed)
LOV cardio Dr Laverta [place] 04-18-16 epic

## 2016-11-21 ENCOUNTER — Encounter (HOSPITAL_COMMUNITY)
Admission: RE | Admit: 2016-11-21 | Discharge: 2016-11-21 | Disposition: A | Discharge status: Home / Self Care | Payer: Medicare Other | Source: Ambulatory Visit

## 2016-11-21 ENCOUNTER — Telehealth: Payer: Self-pay

## 2016-11-21 NOTE — Progress Notes (Signed)
Nurse contacted patient by phone. Patient reports she is out of town and won't be able to make her 11am pre-op appointment today. patient chart given over to phone call room to reschedule pre-op appointment.

## 2016-11-21 NOTE — Telephone Encounter (Signed)
Sent clearance document to Angelica Guerrero. Dr. Lovena Le stated "Patient is cleared for surgery with the following recommendations - "Low risk".

## 2016-11-22 NOTE — Patient Instructions (Addendum)
Angelica Guerrero  11/22/2016   Your procedure is scheduled on: 11-28-16  [facility]  Entrance take [facility]  elevators to 3rd floor to  [place] at 515  AM.  Call this number if you have problems the morning of surgery [phone]   Remember: ONLY 1 PERSON MAY GO WITH YOU TO SHORT STAY TO GET  READY MORNING OF [place].  Do not eat food or drink liquids :After Midnight.    BRING CPAP MASK AND TUBING  Take these medicines the morning of surgery with A SIP OF WATER: fluoxetin (prozac), rosuvastatin (crestor), nexium              You may not have any metal on your body including hair pins and              piercings  Do not wear jewelry, make-up, lotions, powders or perfumes, deodorant             Do not wear nail polish.  Do not shave  48 hours prior to surgery.              Men may shave face and neck.   Do not bring valuables to the hospital. [place].  Contacts, dentures or bridgework may not be worn into surgery.  Leave suitcase in the car. After surgery it may be brought to your room.                  Please read over the following fact sheets you were given: _____________________________________________________________________             [facility] - Preparing for Surgery Before surgery, you can play an important role.  Because skin is not sterile, your skin needs to be as free of germs as possible.  You can reduce the number of germs on your skin by washing with CHG (chlorahexidine gluconate) soap before surgery.  CHG is an antiseptic cleaner which kills germs and bonds with the skin to continue killing germs even after washing. Please DO NOT use if you have an allergy to CHG or antibacterial soaps.  If your skin becomes reddened/irritated stop using the CHG and inform your nurse when you arrive at Short Stay. Do not shave (including legs and underarms) for at least 48 hours  prior to the first CHG shower.  You may shave your face/neck. Please follow these instructions carefully:  1.  Shower with CHG Soap the night before surgery and the  morning of Surgery.  2.  If you choose to wash your hair, wash your hair first as usual with your  normal  shampoo.  3.  After you shampoo, rinse your hair and body thoroughly to remove the  shampoo.                           4.  Use CHG as you would any other liquid soap.  You can apply chg directly  to the skin and wash                       Gently with a scrungie or clean washcloth.  5.  Apply the CHG Soap to your body ONLY FROM THE NECK DOWN.  Do not use on face/ open                           Wound or open sores. Avoid contact with eyes, ears mouth and genitals (private parts).                       Wash face,  Genitals (private parts) with your normal soap.             6.  Wash thoroughly, paying special attention to the area where your surgery  will be performed.  7.  Thoroughly rinse your body with warm water from the neck down.  8.  DO NOT shower/wash with your normal soap after using and rinsing off  the CHG Soap.                9.  Pat yourself dry with a clean towel.            10.  Wear clean pajamas.            11.  Place clean sheets on your bed the night of your first shower and do not  sleep with pets. Day of Surgery : Do not apply any lotions/deodorants the morning of surgery.  Please wear clean clothes to the hospital/surgery center.  FAILURE TO FOLLOW THESE INSTRUCTIONS MAY RESULT IN THE CANCELLATION OF YOUR SURGERY PATIENT SIGNATURE_________________________________  NURSE SIGNATURE__________________________________  ________________________________________________________________________   Adam Phenix  An incentive spirometer is a tool that can help keep your lungs clear and active. This tool measures how well you are filling your lungs with each breath. Taking long deep breaths may help reverse  or decrease the chance of developing breathing (pulmonary) problems (especially infection) following:  A long period of time when you are unable to move or be active. BEFORE THE PROCEDURE   If the spirometer includes an indicator to show your best effort, your nurse or respiratory therapist will set it to a desired goal.  If possible, sit up straight or lean slightly forward. Try not to slouch.  Hold the incentive spirometer in an upright position. INSTRUCTIONS FOR USE  1. Sit on the edge of your bed if possible, or sit up as far as you can in bed or on a chair. 2. Hold the incentive spirometer in an upright position. 3. Breathe out normally. 4. Place the mouthpiece in your mouth and seal your lips tightly around it. 5. Breathe in slowly and as deeply as possible, raising the piston or the ball toward the top of the column. 6. Hold your breath for 3-5 seconds or for as long as possible. Allow the piston or ball to fall to the bottom of the column. 7. Remove the mouthpiece from your mouth and breathe out normally. 8. Rest for a few seconds and repeat Steps 1 through 7 at least 10 times every 1-2 hours when you are awake. Take your time and take a few normal breaths between deep breaths. 9. The spirometer may include an indicator to show your best effort. Use the indicator as a goal to work toward during each repetition. 10. After each set of 10 deep breaths, practice coughing to be sure your lungs are clear. If you have an incision (the cut made at the time of surgery), support your incision when coughing by placing a pillow or rolled up towels firmly against it. Once you are able to get out of  bed, walk around indoors and cough well. You may stop using the incentive spirometer when instructed by your caregiver.  RISKS AND COMPLICATIONS  Take your time so you do not get dizzy or light-headed.  If you are in pain, you may need to take or ask for pain medication before doing incentive  spirometry. It is harder to take a deep breath if you are having pain. AFTER USE  Rest and breathe slowly and easily.  It can be helpful to keep track of a log of your progress. Your caregiver can provide you with a simple table to help with this. If you are using the spirometer at home, follow these instructions: [place] IF:   You are having difficultly using the spirometer.  You have trouble using the spirometer as often as instructed.  Your pain medication is not giving enough relief while using the spirometer.  You develop fever of 100.5 F (38.1 C) or higher. SEEK IMMEDIATE MEDICAL CARE IF:   You cough up bloody sputum that had not been present before.  You develop fever of 102 F (38.9 C) or greater.  You develop worsening pain at or near the incision site. MAKE SURE YOU:   Understand these instructions.  Will watch your condition.  Will get help right away if you are not doing well or get worse. Document Released: 02/13/2007 Document Revised: 12/26/2011 Document Reviewed: 04/16/2007 Exit[employer] Patient Information 2014 [employer], [place].   ________________________________________________________________________  WHAT IS A BLOOD TRANSFUSION? Blood Transfusion Information  A transfusion is the replacement of blood or some of its parts. Blood is made up of multiple cells which provide different functions.  Red blood cells carry oxygen and are used for blood loss replacement.  White blood cells fight against infection.  Platelets control bleeding.  Plasma helps clot blood.  Other blood products are available for specialized needs, such as hemophilia or other clotting disorders. BEFORE THE TRANSFUSION  Who gives blood for transfusions?   Healthy volunteers who are fully evaluated to make sure their blood is safe. This is blood bank blood. Transfusion therapy is the safest it has ever been in the practice of medicine. Before blood is taken from a donor, a  complete history is taken to make sure that person has no history of diseases nor engages in risky social behavior (examples are intravenous drug use or sexual activity with multiple partners). The donor's travel history is screened to minimize risk of transmitting infections, such as malaria. The donated blood is tested for signs of infectious diseases, such as HIV and hepatitis. The blood is then tested to be sure it is compatible with you in order to minimize the chance of a transfusion reaction. If you or a relative donates blood, this is often done in anticipation of surgery and is not appropriate for emergency situations. It takes many days to process the donated blood. RISKS AND COMPLICATIONS Although transfusion therapy is very safe and saves many lives, the main dangers of transfusion include:   Getting an infectious disease.  Developing a transfusion reaction. This is an allergic reaction to something in the blood you were given. Every precaution is taken to prevent this. The decision to have a blood transfusion has been considered carefully by your caregiver before blood is given. Blood is not given unless the benefits outweigh the risks. AFTER THE TRANSFUSION  Right after receiving a blood transfusion, you will usually feel much better and more energetic. This is especially true if your red blood  cells have gotten low (anemic). The transfusion raises the level of the red blood cells which carry oxygen, and this usually causes an energy increase.  The nurse administering the transfusion will monitor you carefully for complications. HOME CARE INSTRUCTIONS  No special instructions are needed after a transfusion. You may find your energy is better. Speak with your caregiver about any limitations on activity for underlying diseases you may have. SEEK MEDICAL CARE IF:   Your condition is not improving after your transfusion.  You develop redness or irritation at the intravenous (IV)  site. SEEK IMMEDIATE MEDICAL CARE IF:  Any of the following symptoms occur over the next 12 hours:  Shaking chills.  You have a temperature by mouth above 102 F (38.9 C), not controlled by medicine.  Chest, back, or muscle pain.  People around you feel you are not acting correctly or are confused.  Shortness of breath or difficulty breathing.  Dizziness and fainting.  You get a rash or develop hives.  You have a decrease in urine output.  Your urine turns a dark color or changes to pink, red, or brown. Any of the following symptoms occur over the next 10 days:  You have a temperature by mouth above 102 F (38.9 C), not controlled by medicine.  Shortness of breath.  Weakness after normal activity.  The white part of the eye turns yellow (jaundice).  You have a decrease in the amount of urine or are urinating less often.  Your urine turns a dark color or changes to pink, red, or brown. Document Released: 09/30/2000 Document Revised: 12/26/2011 Document Reviewed: 05/19/2008 [facility] Patient Information 2014 [place], [place].  _______________________________________________________________________

## 2016-11-23 ENCOUNTER — Encounter (HOSPITAL_COMMUNITY): Payer: Self-pay

## 2016-11-23 ENCOUNTER — Encounter (HOSPITAL_COMMUNITY)
Admission: RE | Admit: 2016-11-23 | Discharge: 2016-11-23 | Disposition: A | Discharge status: Home / Self Care | Payer: Medicare Other | Source: Ambulatory Visit | Attending: Orthopedic Surgery | Admitting: Orthopedic Surgery

## 2016-11-23 ENCOUNTER — Other Ambulatory Visit (HOSPITAL_COMMUNITY): Payer: Self-pay | Admitting: *Deleted

## 2016-11-23 DIAGNOSIS — M1712 Unilateral primary osteoarthritis, left knee: Secondary | ICD-10-CM | POA: Diagnosis not present

## 2016-11-23 DIAGNOSIS — Z01818 Encounter for other preprocedural examination: Secondary | ICD-10-CM | POA: Diagnosis not present

## 2016-11-23 DIAGNOSIS — Z0183 Encounter for blood typing: Secondary | ICD-10-CM | POA: Diagnosis not present

## 2016-11-23 DIAGNOSIS — R001 Bradycardia, unspecified: Secondary | ICD-10-CM | POA: Insufficient documentation

## 2016-11-23 DIAGNOSIS — I10 Essential (primary) hypertension: Secondary | ICD-10-CM | POA: Insufficient documentation

## 2016-11-23 DIAGNOSIS — Z01812 Encounter for preprocedural laboratory examination: Secondary | ICD-10-CM | POA: Diagnosis not present

## 2016-11-23 HISTORY — DX: Changes in skin texture: R23.4

## 2016-11-23 LAB — COMPREHENSIVE METABOLIC PANEL
ALBUMIN: 4.3 g/dL (ref 3.5–5.0)
ALK PHOS: 53 U/L (ref 38–126)
ALT: 22 U/L (ref 14–54)
ANION GAP: 8 (ref 5–15)
AST: 26 U/L (ref 15–41)
BUN: 19 mg/dL (ref 6–20)
CALCIUM: 9.9 mg/dL (ref 8.9–10.3)
CHLORIDE: 103 mmol/L (ref 101–111)
CO2: 27 mmol/L (ref 22–32)
Creatinine, Ser: 0.86 mg/dL (ref 0.44–1.00)
GFR calc non Af Amer: >60 mL/min (ref >60)
GLUCOSE: 102 mg/dL — AB (ref 65–99)
POTASSIUM: 4.2 mmol/L (ref 3.5–5.1)
SODIUM: 138 mmol/L (ref 135–145)
Total Bilirubin: 0.8 mg/dL (ref 0.3–1.2)
Total Protein: 7.7 g/dL (ref 6.5–8.1)

## 2016-11-23 LAB — PROTIME-INR
INR: 0.95
Prothrombin Time: 12.7 seconds (ref 11.4–15.2)

## 2016-11-23 LAB — CBC
HCT: 43.4 % (ref 36.0–46.0)
Hemoglobin: 14.1 g/dL (ref 12.0–15.0)
MCH: 32 pg (ref 26.0–34.0)
MCHC: 32.5 g/dL (ref 30.0–36.0)
MCV: 98.6 fL (ref 78.0–100.0)
PLATELETS: 237 10*3/uL (ref 150–400)
RBC: 4.4 MIL/uL (ref 3.87–5.11)
RDW: 13.1 % (ref 11.5–15.5)
WBC: 6.5 10*3/uL (ref 4.0–10.5)

## 2016-11-23 LAB — SURGICAL PCR SCREEN
MRSA, PCR: NEGATIVE
Staphylococcus aureus: NEGATIVE

## 2016-11-23 LAB — APTT: APTT: 24 s (ref 24–36)

## 2016-11-23 NOTE — Progress Notes (Signed)
MEDICAL CLEARANCE NOTE DR RUSSO 11-16 17 ON CHART

## 2016-11-28 ENCOUNTER — Encounter (HOSPITAL_COMMUNITY): Payer: Self-pay | Admitting: *Deleted

## 2016-11-28 ENCOUNTER — Inpatient Hospital Stay (HOSPITAL_COMMUNITY): Payer: Medicare Other | Admitting: Registered Nurse

## 2016-11-28 ENCOUNTER — Encounter (HOSPITAL_COMMUNITY)
Admission: RE | Disposition: A | Discharge status: Home / Self Care | Payer: Self-pay | Source: Ambulatory Visit | Attending: Orthopedic Surgery

## 2016-11-28 ENCOUNTER — Inpatient Hospital Stay (HOSPITAL_COMMUNITY)
Admission: RE | Admit: 2016-11-28 | Discharge: 2016-11-30 | DRG: 470 | Disposition: A | Discharge status: Home / Self Care | Payer: Medicare Other | Source: Ambulatory Visit | Attending: Orthopedic Surgery | Admitting: Orthopedic Surgery

## 2016-11-28 DIAGNOSIS — Z79899 Other long term (current) drug therapy: Secondary | ICD-10-CM | POA: Diagnosis not present

## 2016-11-28 DIAGNOSIS — G4733 Obstructive sleep apnea (adult) (pediatric): Secondary | ICD-10-CM | POA: Diagnosis not present

## 2016-11-28 DIAGNOSIS — K219 Gastro-esophageal reflux disease without esophagitis: Secondary | ICD-10-CM | POA: Diagnosis present

## 2016-11-28 DIAGNOSIS — E785 Hyperlipidemia, unspecified: Secondary | ICD-10-CM | POA: Diagnosis present

## 2016-11-28 DIAGNOSIS — Z96651 Presence of right artificial knee joint: Secondary | ICD-10-CM | POA: Diagnosis present

## 2016-11-28 DIAGNOSIS — Z87891 Personal history of nicotine dependence: Secondary | ICD-10-CM | POA: Diagnosis not present

## 2016-11-28 DIAGNOSIS — G8918 Other acute postprocedural pain: Secondary | ICD-10-CM | POA: Diagnosis not present

## 2016-11-28 DIAGNOSIS — M1712 Unilateral primary osteoarthritis, left knee: Secondary | ICD-10-CM | POA: Diagnosis not present

## 2016-11-28 DIAGNOSIS — M25562 Pain in left knee: Secondary | ICD-10-CM | POA: Diagnosis not present

## 2016-11-28 DIAGNOSIS — M179 Osteoarthritis of knee, unspecified: Secondary | ICD-10-CM

## 2016-11-28 DIAGNOSIS — M1710 Unilateral primary osteoarthritis, unspecified knee: Secondary | ICD-10-CM

## 2016-11-28 DIAGNOSIS — Z7982 Long term (current) use of aspirin: Secondary | ICD-10-CM | POA: Diagnosis not present

## 2016-11-28 DIAGNOSIS — I359 Nonrheumatic aortic valve disorder, unspecified: Secondary | ICD-10-CM | POA: Diagnosis present

## 2016-11-28 HISTORY — PX: TOTAL KNEE ARTHROPLASTY: SHX125

## 2016-11-28 LAB — TYPE AND SCREEN
ABO/RH(D): A POS
ANTIBODY SCREEN: NEGATIVE

## 2016-11-28 SURGERY — ARTHROPLASTY, KNEE, TOTAL
Anesthesia: Spinal | Site: Knee | Laterality: Left

## 2016-11-28 MED ORDER — CHLORHEXIDINE GLUCONATE 4 % EX LIQD: 60.0000 mL | Freq: Once | CUTANEOUS | Status: DC

## 2016-11-28 MED ORDER — PHENOL 1.4 % MT LIQD
1.0000 | OROMUCOSAL | Status: DC | PRN
  Filled 2016-11-28: qty 177

## 2016-11-28 MED ORDER — OXYCODONE HCL 5 MG PO TABS
5.0000 mg | ORAL_TABLET | ORAL | Status: DC | PRN
  Administered 2016-11-28: 10 mg via ORAL
  Administered 2016-11-28: 18:00:00 5 mg via ORAL
  Administered 2016-11-29: 23:00:00 10 mg via ORAL
  Administered 2016-11-29 (×3): 5 mg via ORAL
  Administered 2016-11-29: 10 mg via ORAL
  Administered 2016-11-29 (×2): 5 mg via ORAL
  Administered 2016-11-30: 10 mg via ORAL
  Filled 2016-11-28 (×2): qty 1
  Filled 2016-11-28: qty 2
  Filled 2016-11-28 (×2): qty 1
  Filled 2016-11-28 (×2): qty 2
  Filled 2016-11-28 (×2): qty 1
  Filled 2016-11-28: qty 2

## 2016-11-28 MED ORDER — ROSUVASTATIN CALCIUM 20 MG PO TABS
40.0000 mg | ORAL_TABLET | Freq: Every day | ORAL | Status: DC
  Administered 2016-11-29 – 2016-11-30 (×2): 40 mg via ORAL
  Filled 2016-11-28 (×2): qty 2

## 2016-11-28 MED ORDER — BISACODYL 10 MG RE SUPP
10.0000 mg | Freq: Every day | RECTAL | Status: DC | PRN
Start: 2016-11-28 — End: 2016-11-30

## 2016-11-28 MED ORDER — PANTOPRAZOLE SODIUM 40 MG PO TBEC: 40.0000 mg | DELAYED_RELEASE_TABLET | Freq: Every day | ORAL | Status: DC

## 2016-11-28 MED ORDER — DOCUSATE SODIUM 100 MG PO CAPS
100.0000 mg | ORAL_CAPSULE | Freq: Two times a day (BID) | ORAL | Status: DC
  Administered 2016-11-28 – 2016-11-30 (×4): 100 mg via ORAL
  Filled 2016-11-28 (×4): qty 1

## 2016-11-28 MED ORDER — SODIUM CHLORIDE 0.9 % IV SOLN
INTRAVENOUS | Status: DC
  Administered 2016-11-28 – 2016-11-30 (×2): via INTRAVENOUS

## 2016-11-28 MED ORDER — PROPOFOL 500 MG/50ML IV EMUL
INTRAVENOUS | Status: DC | PRN
  Administered 2016-11-28: 40 ug/kg/min via INTRAVENOUS

## 2016-11-28 MED ORDER — LACTATED RINGERS IV SOLN: INTRAVENOUS | Status: DC

## 2016-11-28 MED ORDER — METOCLOPRAMIDE HCL 5 MG PO TABS: 5.0000 mg | ORAL_TABLET | Freq: Three times a day (TID) | ORAL | Status: DC | PRN

## 2016-11-28 MED ORDER — HYDROMORPHONE HCL 1 MG/ML IJ SOLN
INTRAMUSCULAR | Status: AC
  Filled 2016-11-28: qty 1

## 2016-11-28 MED ORDER — FLEET ENEMA 7-19 GM/118ML RE ENEM: 1.0000 | ENEMA | Freq: Once | RECTAL | Status: DC | PRN

## 2016-11-28 MED ORDER — ONDANSETRON HCL 4 MG PO TABS: 4.0000 mg | ORAL_TABLET | Freq: Four times a day (QID) | ORAL | Status: DC | PRN

## 2016-11-28 MED ORDER — MENTHOL 3 MG MT LOZG: 1.0000 | LOZENGE | OROMUCOSAL | Status: DC | PRN

## 2016-11-28 MED ORDER — MIDAZOLAM HCL 2 MG/2ML IJ SOLN
INTRAMUSCULAR | Status: AC
  Filled 2016-11-28: qty 2

## 2016-11-28 MED ORDER — BUPIVACAINE LIPOSOME 1.3 % IJ SUSP
20.0000 mL | Freq: Once | INTRAMUSCULAR | Status: DC
  Filled 2016-11-28: qty 20

## 2016-11-28 MED ORDER — DEXAMETHASONE SODIUM PHOSPHATE 10 MG/ML IJ SOLN
INTRAMUSCULAR | Status: AC
  Filled 2016-11-28: qty 1

## 2016-11-28 MED ORDER — CEFAZOLIN SODIUM-DEXTROSE 2-4 GM/100ML-% IV SOLN
2.0000 g | INTRAVENOUS | Status: AC
  Administered 2016-11-28: 2 g via INTRAVENOUS

## 2016-11-28 MED ORDER — ACETAMINOPHEN 10 MG/ML IV SOLN
INTRAVENOUS | Status: AC
  Filled 2016-11-28: qty 100

## 2016-11-28 MED ORDER — PROMETHAZINE HCL 25 MG/ML IJ SOLN: 6.2500 mg | INTRAMUSCULAR | Status: DC | PRN

## 2016-11-28 MED ORDER — TRANEXAMIC ACID 1000 MG/10ML IV SOLN
2000.0000 mg | Freq: Once | INTRAVENOUS | Status: DC
  Filled 2016-11-28: qty 20

## 2016-11-28 MED ORDER — ONDANSETRON HCL 4 MG/2ML IJ SOLN: 4.0000 mg | Freq: Four times a day (QID) | INTRAMUSCULAR | Status: DC | PRN

## 2016-11-28 MED ORDER — PROPOFOL 10 MG/ML IV BOLUS
INTRAVENOUS | Status: AC
  Filled 2016-11-28: qty 40

## 2016-11-28 MED ORDER — METHOCARBAMOL 1000 MG/10ML IJ SOLN
500.0000 mg | Freq: Four times a day (QID) | INTRAVENOUS | Status: DC | PRN
  Administered 2016-11-28: 500 mg via INTRAVENOUS
  Filled 2016-11-28: qty 5
  Filled 2016-11-28: qty 550

## 2016-11-28 MED ORDER — HYDROMORPHONE HCL 1 MG/ML IJ SOLN
0.5000 mg | INTRAMUSCULAR | Status: DC | PRN
  Administered 2016-11-28: 23:00:00 0.5 mg via INTRAVENOUS
  Filled 2016-11-28: qty 0.5

## 2016-11-28 MED ORDER — ROPIVACAINE HCL 7.5 MG/ML IJ SOLN
INTRAMUSCULAR | Status: DC | PRN
  Administered 2016-11-28: 20 mL via PERINEURAL

## 2016-11-28 MED ORDER — LIDOCAINE 2% (20 MG/ML) 5 ML SYRINGE
INTRAMUSCULAR | Status: DC | PRN
  Administered 2016-11-28: 100 mg via INTRAVENOUS

## 2016-11-28 MED ORDER — ONDANSETRON HCL 4 MG/2ML IJ SOLN
INTRAMUSCULAR | Status: AC
  Filled 2016-11-28: qty 2

## 2016-11-28 MED ORDER — DEXAMETHASONE SODIUM PHOSPHATE 10 MG/ML IJ SOLN: 10.0000 mg | Freq: Once | INTRAMUSCULAR | Status: DC

## 2016-11-28 MED ORDER — LIDOCAINE 2% (20 MG/ML) 5 ML SYRINGE
INTRAMUSCULAR | Status: AC
  Filled 2016-11-28: qty 5

## 2016-11-28 MED ORDER — MEPERIDINE HCL 50 MG/ML IJ SOLN
INTRAMUSCULAR | Status: AC
  Filled 2016-11-28: qty 1

## 2016-11-28 MED ORDER — DEXAMETHASONE SODIUM PHOSPHATE 10 MG/ML IJ SOLN
10.0000 mg | Freq: Once | INTRAMUSCULAR | Status: AC
  Administered 2016-11-28: 10 mg via INTRAVENOUS

## 2016-11-28 MED ORDER — BUPIVACAINE LIPOSOME 1.3 % IJ SUSP
INTRAMUSCULAR | Status: DC | PRN
  Administered 2016-11-28: 20 mL

## 2016-11-28 MED ORDER — DONEPEZIL HCL 10 MG PO TABS
5.0000 mg | ORAL_TABLET | Freq: Every day | ORAL | Status: DC
  Administered 2016-11-28 – 2016-11-29 (×2): 5 mg via ORAL
  Filled 2016-11-28 (×2): qty 1

## 2016-11-28 MED ORDER — DIPHENHYDRAMINE HCL 12.5 MG/5ML PO ELIX: 12.5000 mg | ORAL_SOLUTION | ORAL | Status: DC | PRN

## 2016-11-28 MED ORDER — ACETAMINOPHEN 650 MG RE SUPP: 650.0000 mg | Freq: Four times a day (QID) | RECTAL | Status: DC | PRN

## 2016-11-28 MED ORDER — MIDAZOLAM HCL 5 MG/5ML IJ SOLN
INTRAMUSCULAR | Status: DC | PRN
Start: 2016-11-28 — End: 2016-11-28
  Administered 2016-11-28: 2 mg via INTRAVENOUS

## 2016-11-28 MED ORDER — SODIUM CHLORIDE 0.9 % IJ SOLN
INTRAMUSCULAR | Status: DC | PRN
  Administered 2016-11-28: 30 mL

## 2016-11-28 MED ORDER — ONDANSETRON HCL 4 MG/2ML IJ SOLN
INTRAMUSCULAR | Status: DC | PRN
  Administered 2016-11-28: 4 mg via INTRAVENOUS

## 2016-11-28 MED ORDER — MEPERIDINE HCL 50 MG/ML IJ SOLN
6.2500 mg | INTRAMUSCULAR | Status: DC | PRN
  Administered 2016-11-28: 12.5 mg via INTRAVENOUS

## 2016-11-28 MED ORDER — METHOCARBAMOL 500 MG PO TABS
500.0000 mg | ORAL_TABLET | Freq: Four times a day (QID) | ORAL | Status: DC | PRN
  Administered 2016-11-28 – 2016-11-29 (×3): 500 mg via ORAL
  Filled 2016-11-28 (×3): qty 1

## 2016-11-28 MED ORDER — SODIUM CHLORIDE 0.9 % IJ SOLN
INTRAMUSCULAR | Status: AC
  Filled 2016-11-28: qty 50

## 2016-11-28 MED ORDER — CEFAZOLIN SODIUM-DEXTROSE 2-4 GM/100ML-% IV SOLN
INTRAVENOUS | Status: AC
  Filled 2016-11-28: qty 100

## 2016-11-28 MED ORDER — HYDROMORPHONE HCL 1 MG/ML IJ SOLN
0.2500 mg | INTRAMUSCULAR | Status: DC | PRN
  Administered 2016-11-28: 0.25 mg via INTRAVENOUS
  Administered 2016-11-28: 0.5 mg via INTRAVENOUS
  Administered 2016-11-28: 0.25 mg via INTRAVENOUS

## 2016-11-28 MED ORDER — METOCLOPRAMIDE HCL 5 MG/ML IJ SOLN: 5.0000 mg | Freq: Three times a day (TID) | INTRAMUSCULAR | Status: DC | PRN

## 2016-11-28 MED ORDER — FENTANYL CITRATE (PF) 100 MCG/2ML IJ SOLN
INTRAMUSCULAR | Status: AC
  Filled 2016-11-28: qty 2

## 2016-11-28 MED ORDER — POLYETHYLENE GLYCOL 3350 17 G PO PACK: 17.0000 g | PACK | Freq: Every day | ORAL | Status: DC | PRN

## 2016-11-28 MED ORDER — RIVAROXABAN 10 MG PO TABS
10.0000 mg | ORAL_TABLET | Freq: Every day | ORAL | Status: DC
  Administered 2016-11-29 – 2016-11-30 (×2): 10 mg via ORAL
  Filled 2016-11-28 (×2): qty 1

## 2016-11-28 MED ORDER — LACTATED RINGERS IV SOLN
INTRAVENOUS | Status: DC | PRN
  Administered 2016-11-28: 07:00:00 via INTRAVENOUS

## 2016-11-28 MED ORDER — TRANEXAMIC ACID 1000 MG/10ML IV SOLN
INTRAVENOUS | Status: DC | PRN
  Administered 2016-11-28: 2000 mg via TOPICAL

## 2016-11-28 MED ORDER — ACETAMINOPHEN 325 MG PO TABS: 650.0000 mg | ORAL_TABLET | Freq: Four times a day (QID) | ORAL | Status: DC | PRN

## 2016-11-28 MED ORDER — ACETAMINOPHEN 500 MG PO TABS
1000.0000 mg | ORAL_TABLET | Freq: Four times a day (QID) | ORAL | Status: AC
  Administered 2016-11-28 – 2016-11-29 (×4): 1000 mg via ORAL
  Filled 2016-11-28 (×4): qty 2

## 2016-11-28 MED ORDER — FENTANYL CITRATE (PF) 100 MCG/2ML IJ SOLN
INTRAMUSCULAR | Status: DC | PRN
  Administered 2016-11-28 (×2): 50 ug via INTRAVENOUS

## 2016-11-28 MED ORDER — BUPIVACAINE HCL (PF) 0.25 % IJ SOLN
INTRAMUSCULAR | Status: AC
  Filled 2016-11-28: qty 30

## 2016-11-28 MED ORDER — CEFAZOLIN SODIUM-DEXTROSE 2-4 GM/100ML-% IV SOLN
2.0000 g | Freq: Four times a day (QID) | INTRAVENOUS | Status: AC
  Administered 2016-11-28 (×2): 2 g via INTRAVENOUS
  Filled 2016-11-28 (×2): qty 100

## 2016-11-28 MED ORDER — ACETAMINOPHEN 10 MG/ML IV SOLN
1000.0000 mg | Freq: Once | INTRAVENOUS | Status: AC
  Administered 2016-11-28: 1000 mg via INTRAVENOUS

## 2016-11-28 MED ORDER — FLUOXETINE HCL 20 MG PO CAPS
40.0000 mg | ORAL_CAPSULE | Freq: Every day | ORAL | Status: DC
  Administered 2016-11-29 – 2016-11-30 (×2): 40 mg via ORAL
  Filled 2016-11-28 (×2): qty 2

## 2016-11-28 MED ORDER — SODIUM CHLORIDE 0.9 % IR SOLN
Status: DC | PRN
  Administered 2016-11-28: 1000 mL

## 2016-11-28 SURGICAL SUPPLY — 50 items
BAG DECANTER FOR FLEXI CONT (MISCELLANEOUS) ×3 IMPLANT
BAG SPEC THK2 15X12 ZIP CLS (MISCELLANEOUS) ×1
BAG ZIPLOCK 12X15 (MISCELLANEOUS) ×3 IMPLANT
BANDAGE ACE 6X5 VEL STRL LF (GAUZE/BANDAGES/DRESSINGS) ×3 IMPLANT
BLADE SAG 18X100X1.27 (BLADE) ×3 IMPLANT
BLADE SAW SGTL 11.0X1.19X90.0M (BLADE) ×3 IMPLANT
BOWL SMART MIX CTS (DISPOSABLE) ×3 IMPLANT
CAPT KNEE TOTAL 3 ATTUNE ×2 IMPLANT
CEMENT HV SMART SET (Cement) ×6 IMPLANT
CLOSURE WOUND 1/2 X4 (GAUZE/BANDAGES/DRESSINGS) ×1
CLOTH BEACON ORANGE TIMEOUT ST (SAFETY) ×3 IMPLANT
CUFF TOURN SGL QUICK 34 (TOURNIQUET CUFF) ×3
CUFF TRNQT CYL 34X4X40X1 (TOURNIQUET CUFF) ×1 IMPLANT
DECANTER SPIKE VIAL GLASS SM (MISCELLANEOUS) ×3 IMPLANT
DRAPE U-SHAPE 47X51 STRL (DRAPES) ×3 IMPLANT
DRSG ADAPTIC 3X8 NADH LF (GAUZE/BANDAGES/DRESSINGS) ×3 IMPLANT
DRSG PAD ABDOMINAL 8X10 ST (GAUZE/BANDAGES/DRESSINGS) ×3 IMPLANT
DURAPREP 26ML APPLICATOR (WOUND CARE) ×3 IMPLANT
ELECT REM PT RETURN 9FT ADLT (ELECTROSURGICAL) ×3
ELECTRODE REM PT RTRN 9FT ADLT (ELECTROSURGICAL) ×1 IMPLANT
EVACUATOR 1/8 PVC DRAIN (DRAIN) ×3 IMPLANT
GAUZE SPONGE 4X4 12PLY STRL (GAUZE/BANDAGES/DRESSINGS) ×3 IMPLANT
GLOVE BIO SURGEON STRL SZ7.5 (GLOVE) IMPLANT
GLOVE BIO SURGEON STRL SZ8 (GLOVE) ×3 IMPLANT
GLOVE BIOGEL PI IND STRL 6.5 (GLOVE) IMPLANT
GLOVE BIOGEL PI IND STRL 8 (GLOVE) ×1 IMPLANT
GLOVE BIOGEL PI INDICATOR 6.5 (GLOVE)
GLOVE BIOGEL PI INDICATOR 8 (GLOVE) ×2
GLOVE SURG SS PI 6.5 STRL IVOR (GLOVE) IMPLANT
GOWN STRL REUS W/TWL LRG LVL3 (GOWN DISPOSABLE) ×3 IMPLANT
GOWN STRL REUS W/TWL XL LVL3 (GOWN DISPOSABLE) IMPLANT
HANDPIECE INTERPULSE COAX TIP (DISPOSABLE) ×3
IMMOBILIZER KNEE 20 (SOFTGOODS) ×3
IMMOBILIZER KNEE 20 THIGH 36 (SOFTGOODS) ×1 IMPLANT
MANIFOLD NEPTUNE II (INSTRUMENTS) ×3 IMPLANT
NS IRRIG 1000ML POUR BTL (IV SOLUTION) ×3 IMPLANT
PACK TOTAL KNEE CUSTOM (KITS) ×3 IMPLANT
PADDING CAST COTTON 6X4 STRL (CAST SUPPLIES) ×7 IMPLANT
POSITIONER SURGICAL ARM (MISCELLANEOUS) ×3 IMPLANT
SET HNDPC FAN SPRY TIP SCT (DISPOSABLE) ×1 IMPLANT
STRIP CLOSURE SKIN 1/2X4 (GAUZE/BANDAGES/DRESSINGS) ×3 IMPLANT
SUT MNCRL AB 4-0 PS2 18 (SUTURE) ×3 IMPLANT
SUT VIC AB 2-0 CT1 27 (SUTURE) ×9
SUT VIC AB 2-0 CT1 TAPERPNT 27 (SUTURE) ×3 IMPLANT
SUT VLOC 180 0 24IN GS25 (SUTURE) ×3 IMPLANT
SYR 50ML LL SCALE MARK (SYRINGE) ×3 IMPLANT
TRAY FOLEY W/METER SILVER 16FR (SET/KITS/TRAYS/PACK) ×3 IMPLANT
WATER STERILE IRR 1500ML POUR (IV SOLUTION) ×3 IMPLANT
WRAP KNEE MAXI GEL POST OP (GAUZE/BANDAGES/DRESSINGS) ×3 IMPLANT
YANKAUER SUCT BULB TIP 10FT TU (MISCELLANEOUS) ×3 IMPLANT

## 2016-11-28 NOTE — H&P (View-Only) (Signed)
Angelica Guerrero DOB: [birth year] Married / Language: Cleophus Molt / Race: White Female Date of Admission:  11/28/2016 CC:  Left Knee Pain History of Present Illness The patient is a 71 year old female who comes in for a preoperative History and Physical. The patient is scheduled for a left total knee arthroplasty to be performed by Dr. Dione Plover. Aluisio, MD at [facility] on 11-28-2016. The patient is a 71 year old female who presented with knee complaints. The patient was seen in referral from [place]. The patient reports left knee symptoms including: pain, swelling, locking, giving way, soreness and grinding (popping) which began 1 month(s) ago in association with an established activity (dancing). The patient describes their pain as sharp.The patient feels that the symptoms are worsening. The patient has the current diagnosis of knee osteoarthritis. Past treatment for this problem has included intra-articular injection of corticosteroids (cortisone injection s/p 3 weeks: did not help). Symptoms are reported to be located in the left knee and left posterior knee and include knee pain and difficulty bearing weight. Symptoms are relieved by rest. Current treatment includes application of ice, use of a walker and nonsteroidal anti-inflammatory drugs. Note for "Knee pain": right TKA 2009: doing good. Left knee scope s/p 3 years ago. Mopsy's left knee has become significantly problematic for her. For many months now she has had progressively worsening pain and dysfunction in the knee. For the past month it has gotten rather intense and hurts with every step. She even have pain at rest and at night. It is really limiting what she can and cannot do. It is as bad or worse in her right knee was already replaced that about eight years ago. She is now ready to get the left knee replaced. They have been treated conservatively in the past for the above stated problem and despite conservative measures, they  continue to have progressive pain and severe functional limitations and dysfunction. They have failed non-operative management including home exercise, medications, and injections. It is felt that they would benefit from undergoing total joint replacement. Risks and benefits of the procedure have been discussed with the patient and they elect to proceed with surgery. There are no active contraindications to surgery such as ongoing infection or rapidly progressive neurological disease.  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 mother Cancer  mother and grandfather fathers side  Social History Marital status  married Living situation  live with spouse Illicit drug use  no Number of flights of stairs before winded  greater than 5 Tobacco / smoke exposure  no Pain Contract  no Current work status  retired Children  3 Alcohol use  current drinker; drinks wine; less than 5 per week Drug/Alcohol Rehab (Currently)  no Former smoker  Exercise  Exercises daily; does running / walking Drug/Alcohol Rehab (Previously)  no Tobacco use  Former smoker. former smoker; smoke(d) less than  1/2 pack(s) per day  Medication History  Aspirin EC Low Strength (81MG  Tablet DR, Oral)  Active. CeleBREX (200MG  Capsule, Oral) Active. BuPROPion HCl ER (XL) (150MG  Tablet ER 24HR, Oral) Active. Rosuvastatin Calcium (40MG  Tablet, Oral) Active. Aricept (5MG  Tablet, Oral) Active. FLUoxetine HCl (Oral) Specific strength unknown - Active. Lotrinex Active. Immodium Active. Nicotine Gum Active. Tylenol PM Active.   Past Surgical History  Anal Fissure Repair  Hemorrhoidectomy  Hysterectomy  complete (cancerous) Total Knee Replacement  right Tubal Ligation  Bunion Surgery - Bilateral    Review of Systems General Not Present- Chills, Fatigue, Fever, Memory Loss, Night Sweats, Weight Gain and Weight Loss. Skin Not Present- Eczema, Hives, Itching, Lesions and Rash. HEENT Not Present- Dentures, Double Vision, Headache, Hearing Loss, Tinnitus and Visual Loss. Respiratory Not Present- Allergies, Chronic Cough, Coughing up blood, Shortness of breath at rest and Shortness of breath with exertion. Note: recent cold but has resolved Cardiovascular Not Present- Chest Pain, Difficulty Breathing Lying Down, Murmur, Palpitations, Racing/skipping heartbeats and Swelling. Gastrointestinal Present- Diarrhea (IBS). Not Present- Abdominal Pain, Bloody Stool, Constipation, Difficulty Swallowing, Heartburn, Jaundice, Loss of appetitie, Nausea and Vomiting. Female Genitourinary Not Present- Blood in Urine, Discharge, Flank Pain, Incontinence, Painful Urination, Urgency, Urinary frequency, Urinary Retention, Urinating at Night and Weak urinary stream. Musculoskeletal Not Present- Back Pain, Joint Pain, Joint Swelling, Morning Stiffness, Muscle Pain, Muscle Weakness and Spasms. Neurological Not Present- Blackout spells, Difficulty with balance, Dizziness, Paralysis, Tremor and Weakness. Psychiatric Not Present- Insomnia.  Vitals Weight: 144 lb Height: 65in Weight was reported by patient. Height was reported by patient. Body Surface Area: 1.72 m Body Mass Index: 23.96 kg/m   Pulse: 68 (Regular)  BP: 128/68 (Sitting, Right Arm, Standard)  Physical Exam General Mental Status -Alert, cooperative and good historian. General Appearance-pleasant, Not in acute distress. Orientation-Oriented X3. Build & Nutrition-Well nourished and Well developed.  Head and Neck Head-normocephalic, atraumatic . Neck Global Assessment - supple, no bruit auscultated on the right, no bruit auscultated on the left.  Eye Vision-Wears corrective lenses. Pupil - Bilateral-Regular and Round. Motion - Bilateral-EOMI.  Chest and Lung Exam Auscultation Breath sounds - clear at anterior chest wall and clear at posterior chest wall. Adventitious sounds - No Adventitious sounds.  Cardiovascular Auscultation Rhythm - Regular rate and rhythm. Heart Sounds - S1 WNL and S2 WNL. Murmurs & Other Heart Sounds - Auscultation of the heart reveals - No Murmurs.  Abdomen Palpation/Percussion Tenderness - Abdomen is non-tender to palpation. Rigidity (guarding) - Abdomen is soft. Auscultation Auscultation of the abdomen reveals - Bowel sounds normal.  Female Genitourinary Note: Not done, not pertinent to present illness   Musculoskeletal Note: On exam, well-developed female, alert, oriented, in no apparent distress. Evaluation of her right knee shows no swelling. Range of motion on the right is 0 to 125. There is no tenderness or instability. Her left knee, varus deformity, range 5 to 125, moderate crepitus on range of motion with tenderness medial greater than lateral, no instability noted. She has significantly antalgic gait pattern on the left.  RADIOGRAPHS AP both knees, lateral of the left showed that she has bone-on-bone arthritis in the medial and patellofemoral compartments of the left knee with large osteophyte formation and tibial subluxation. We also obtained x-ray of the right knee today in followup for her joint replacement and her prosthesis is in excellent  position with no periprosthetic abnormalities.  Assessment & Plan Primary osteoarthritis of left knee (M17.12)  Note:Surgical Plans: Left Total Knee Replacement  Disposition: Home  and straight to outpatient therapy.   Prescription for Physical Therapy given.  PCP: Dr. Virgina Jock  IV TXA  Anesthesia Issues: None  Patient was instructed on what medications to stop prior to surgery.  Signed electronically by Ok Edwards, III PA-C

## 2016-11-28 NOTE — Anesthesia Postprocedure Evaluation (Signed)
Anesthesia Post Note  Patient: Angelica Guerrero  Procedure(s) Performed: Procedure(s) (LRB): LEFT TOTAL KNEE ARTHROPLASTY (Left)  Patient location during evaluation: PACU Anesthesia Type: Spinal Level of consciousness: oriented and awake and alert Pain management: pain level controlled Vital Signs Assessment: post-procedure vital signs reviewed and stable Respiratory status: spontaneous breathing, respiratory function stable and patient connected to nasal cannula oxygen Cardiovascular status: blood pressure returned to baseline and stable Postop Assessment: no headache and no backache Anesthetic complications: no       Last Vitals:  Vitals:   11/28/16 0845 11/28/16 0900  BP: 124/75 (!) 134/104  Pulse: 63 61  Resp: 10 19  Temp: 36.4 C     Last Pain:  Vitals:   11/28/16 0528  TempSrc:   PainSc: 1                  Kaileia Flow S

## 2016-11-28 NOTE — Interval H&P Note (Signed)
History and Physical Interval Note:  11/28/2016 6:35 AM  Angelica Guerrero  has presented today for surgery, with the diagnosis of Left knee Osteoarthritis   The various methods of treatment have been discussed with the patient and family. After consideration of risks, benefits and other options for treatment, the patient has consented to  Procedure(s) with comments: LEFT TOTAL KNEE ARTHROPLASTY (Left) - requests 40mins as a surgical intervention .  The patient's history has been reviewed, patient examined, no change in status, stable for surgery.  I have reviewed the patient's chart and labs.  Questions were answered to the patient's satisfaction.     [name]

## 2016-11-28 NOTE — Op Note (Signed)
OPERATIVE REPORT-TOTAL KNEE ARTHROPLASTY   Pre-operative diagnosis- Osteoarthritis  Left  knee(s)  Post-operative diagnosis- Osteoarthritis Left knee(s)  Procedure-  Left  Total Knee Arthroplasty (Depuy Attune)  Surgeon- Dione Plover. Larnell Granlund, MD  Assistant- Ardeen Jourdain, PA-C  Anesthesia-  Adductor canal block and spinal  EBL-* No blood loss amount entered *   Drains Hemovac  Tourniquet time-  Total Tourniquet Time Documented: Thigh (Left) - 26 minutes Total: Thigh (Left) - 26 minutes     Complications- None  Condition-PACU - hemodynamically stable.   Brief Clinical Note  Angelica Guerrero is a 71 y.o. year old female with end stage OA of her left knee with progressively worsening pain and dysfunction. She has constant pain, with activity and at rest and significant functional deficits with difficulties even with ADLs. She has had extensive non-op management including analgesics, injections of cortisone and viscosupplements, and home exercise program, but remains in significant pain with significant dysfunction. Radiographs show bone on bone arthritis medial and patellofemoral. She presents now for left Total Knee Arthroplasty.    Procedure in detail---   The patient is brought into the operating room and positioned supine on the operating table. After successful administration of  Adductor canal block and spinal,   a tourniquet is placed high on the  Left thigh(s) and the lower extremity is prepped and draped in the usual sterile fashion. Time out is performed by the operating team and then the  Left lower extremity is wrapped in Esmarch, knee flexed and the tourniquet inflated to 300 mmHg.       A midline incision is made with a ten blade through the subcutaneous tissue to the level of the extensor mechanism. A fresh blade is used to make a medial parapatellar arthrotomy. Soft tissue over the proximal medial tibia is subperiosteally elevated to the joint line with a knife and  into the semimembranosus bursa with a Cobb elevator. Soft tissue over the proximal lateral tibia is elevated with attention being paid to avoiding the patellar tendon on the tibial tubercle. The patella is everted, knee flexed 90 degrees and the ACL and PCL are removed. Findings are bone on bone all 3 compartments with large global osteophytes.        The drill is used to create a starting hole in the distal femur and the canal is thoroughly irrigated with sterile saline to remove the fatty contents. The 5 degree Left  valgus alignment guide is placed into the femoral canal and the distal femoral cutting block is pinned to remove 9 mm off the distal femur. Resection is made with an oscillating saw.      The tibia is subluxed forward and the menisci are removed. The extramedullary alignment guide is placed referencing proximally at the medial aspect of the tibial tubercle and distally along the second metatarsal axis and tibial crest. The block is pinned to remove 50mm off the more deficient medial  side. Resection is made with an oscillating saw. Size 5is the most appropriate size for the tibia and the proximal tibia is prepared with the modular drill and keel punch for that size.      The femoral sizing guide is placed and size 6 is most appropriate. Rotation is marked off the epicondylar axis and confirmed by creating a rectangular flexion gap at 90 degrees. The size 6 cutting block is pinned in this rotation and the anterior, posterior and chamfer cuts are made with the oscillating saw. The intercondylar block is then  placed and that cut is made.      Trial size 5 tibial component, trial size 6 narrow posterior stabilized femur and a 8  mm posterior stabilized rotating platform insert trial is placed. Full extension is achieved with excellent varus/valgus and anterior/posterior balance throughout full range of motion. The patella is everted and thickness measured to be 21  mm. Free hand resection is taken to  12 mm, a 35 template is placed, lug holes are drilled, trial patella is placed, and it tracks normally. Osteophytes are removed off the posterior femur with the trial in place. All trials are removed and the cut bone surfaces prepared with pulsatile lavage. Cement is mixed and once ready for implantation, the size 5 tibial implant, size  6 narrow posterior stabilized femoral component, and the size 35 patella are cemented in place and the patella is held with the clamp. The trial insert is placed and the knee held in full extension. The Exparel (20 ml mixed with 30 ml saline) and .25% Bupivicaine, are injected into the extensor mechanism, posterior capsule, medial and lateral gutters and subcutaneous tissues.  All extruded cement is removed and once the cement is hard the permanent 8 mm posterior stabilized rotating platform insert is placed into the tibial tray.      The wound is copiously irrigated with saline solution and the extensor mechanism closed over a hemovac drain with #1 V-loc suture. The tourniquet is released for a total tourniquet time of 26  minutes. Flexion against gravity is 140 degrees and the patella tracks normally. Subcutaneous tissue is closed with 2.0 vicryl and subcuticular with running 4.0 Monocryl. The incision is cleaned and dried and steri-strips and a bulky sterile dressing are applied. The limb is placed into a knee immobilizer and the patient is awakened and transported to recovery in stable condition.      Please note that a surgical assistant was a medical necessity for this procedure in order to perform it in a safe and expeditious manner. Surgical assistant was necessary to retract the ligaments and vital neurovascular structures to prevent injury to them and also necessary for proper positioning of the limb to allow for anatomic placement of the prosthesis.   Dione Plover Heinrich Fertig, MD    11/28/2016, 8:17 AM

## 2016-11-28 NOTE — Evaluation (Signed)
Physical Therapy Evaluation Patient Details Name: Angelica Guerrero MRN: [id number] DOB: 06/15/1946 Today's Date: 11/28/2016   History of Present Illness  L TKA, h/o R TKA  Clinical Impression  Pt is s/p TKA resulting in the deficits listed below (see PT Problem List). Pt ambulated 37' with RW, no loss of balance, min/guard for safety. Initiated TKA exercise program. Good progress expected.  Pt will benefit from skilled PT to increase their independence and safety with mobility to allow discharge to the venue listed below.      Follow Up Recommendations Outpatient PT    Equipment Recommendations  None recommended by PT    Recommendations for Other Services OT consult     Precautions / Restrictions Precautions Precautions: Fall;Knee Precaution Comments: reviewed no pillow under L knee Required Braces or Orthoses: Knee Immobilizer - Left Knee Immobilizer - Left: Discontinue once straight leg raise with < 10 degree lag Restrictions Weight Bearing Restrictions: No      Mobility  Bed Mobility Overal bed mobility: Modified Independent             General bed mobility comments: HOB up 30*  Transfers Overall transfer level: Needs assistance Equipment used: Rolling walker (2 wheeled) Transfers: Sit to/from Stand Sit to Stand: Min guard         General transfer comment: VCs for hand placement, min/guard for safety, pt somewhat impulsive  Ambulation/Gait Ambulation/Gait assistance: Min guard Ambulation Distance (Feet): 60 Feet Assistive device: Rolling walker (2 wheeled) Gait Pattern/deviations: Step-to pattern;Decreased step length - left;Antalgic     General Gait Details: VCs to decr velocity and for sequencing, no LOB  Stairs            Wheelchair Mobility    Modified Rankin (Stroke Patients Only)       Balance Overall balance assessment: Modified Independent                                           Pertinent Vitals/Pain  Pain Assessment: 0-10 Pain Score: 2  Pain Location: L knee Pain Descriptors / Indicators: Sore Pain Intervention(s): Limited activity within patient's tolerance;Monitored during session;Premedicated before session;Ice applied    Home Living Family/patient expects to be discharged to:: Private residence Living Arrangements: Spouse/significant other Available Help at Discharge: Family;Available 24 hours/day Type of Home: House Home Access: Stairs to enter Entrance Stairs-Rails: Left Entrance Stairs-Number of Steps: 5 Home Layout: Two level;Able to live on main level with bedroom/bathroom Home Equipment: Gilford Rile - 2 wheels;Bedside commode      Prior Function Level of Independence: Independent with assistive device(s)         Comments: used RW prn     Hand Dominance        Extremity/Trunk Assessment   Upper Extremity Assessment Upper Extremity Assessment: Overall WFL for tasks assessed    Lower Extremity Assessment Lower Extremity Assessment: LLE deficits/detail LLE Deficits / Details: L knee AAROM 5-45* AAROM, SLR 3/5    Cervical / Trunk Assessment Cervical / Trunk Assessment: Normal  Communication   Communication: No difficulties  Cognition Arousal/Alertness: Awake/alert Behavior During Therapy: WFL for tasks assessed/performed Overall Cognitive Status: Within Functional Limits for tasks assessed                      General Comments      Exercises Total Joint Exercises Ankle Circles/Pumps: AROM;Both;10 reps;Supine Quad Sets: AROM;Left;10  reps;Supine Heel Slides: AAROM;Left;10 reps;Supine Goniometric ROM: 5-45* AAROM L knee   Assessment/Plan    PT Assessment Patient needs continued PT services  PT Problem List Decreased strength;Decreased range of motion;Decreased activity tolerance;Pain;Decreased mobility;Decreased safety awareness          PT Treatment Interventions DME instruction;Gait training;Stair training;Functional mobility  training;Balance training;Therapeutic exercise;Therapeutic activities;Patient/family education    PT Goals (Current goals can be found in the Care Plan section)  Acute Rehab PT Goals Patient Stated Goal: work out at gym, play golf PT Goal Formulation: With patient Time For Goal Achievement: 12/05/16 Potential to Achieve Goals: Good    Frequency 7X/week   Barriers to discharge        Co-evaluation               End of Session Equipment Utilized During Treatment: Gait belt Activity Tolerance: Patient tolerated treatment well Patient left: in chair;with call bell/phone within reach;with chair alarm set Nurse Communication: Mobility status         Time: [id number] PT Time Calculation (min) (ACUTE ONLY): 26 min   Charges:   PT Evaluation $PT Eval Low Complexity: 1 Procedure PT Treatments $Gait Training: 8-22 mins   PT G Codes:        Philomena Doheny 11/28/2016, 2:49 PM [phone]

## 2016-11-28 NOTE — Addendum Note (Signed)
Addendum  created 11/28/16 1024 by Myrtie Soman, MD   Visit Navigator Flowsheet section accepted

## 2016-11-28 NOTE — Transfer of Care (Signed)
Immediate Anesthesia Transfer of Care Note  Patient: Angelica Guerrero  Procedure(s) Performed: Procedure(s) with comments: LEFT TOTAL KNEE ARTHROPLASTY (Left) - requests 67mns  Patient Location: PACU  Anesthesia Type:MAC and Spinal  Level of Consciousness: awake, alert , oriented and patient cooperative  Airway & Oxygen Therapy: Patient Spontanous Breathing and Patient connected to face mask oxygen  Post-op Assessment: Report given to RN and Post -op Vital signs reviewed and stable  Post vital signs: Reviewed and stable  Last Vitals:  Vitals:   11/28/16 0514  BP: (!) 150/78  Pulse: 66  Resp: 16  Temp: 37.1 C    Last Pain:  Vitals:   11/28/16 0528  TempSrc:   PainSc: 1       Patients Stated Pain Goal: 3 ([id number]  Complications: No apparent anesthesia complications

## 2016-11-28 NOTE — Anesthesia Procedure Notes (Signed)
Anesthesia Regional Block:  Adductor canal block  Pre-Anesthetic Checklist: ,, timeout performed, Correct Patient, Correct Site, Correct Laterality, Correct Procedure, Correct Position, site marked, Risks and benefits discussed,  Surgical consent,  Pre-op evaluation,  At surgeon's request and post-op pain management  Laterality: Left  Prep: chloraprep       Needles:  Injection technique: Single-shot  Needle Type: Echogenic Needle     Needle Length: 9cm 9 cm Needle Gauge: 21 G    Additional Needles:  Procedures: ultrasound guided (picture in chart) Adductor canal block Narrative:  Start time: 11/28/2016 7:00 AM End time: 11/28/2016 7:10 AM Injection made incrementally with aspirations every 5 mL.  Performed by: Personally  Anesthesiologist: Barth Trella  Additional Notes: Patient tolerated the procedure well without complications

## 2016-11-28 NOTE — Anesthesia Procedure Notes (Signed)
Spinal  Patient location during procedure: OR Staffing Anesthesiologist: Lyndle Herrlich Preanesthetic Checklist Completed: patient identified, site marked, surgical consent, pre-op evaluation, timeout performed, IV checked, risks and benefits discussed and monitors and equipment checked Spinal Block Patient position: sitting Prep: DuraPrep Patient monitoring: heart rate, cardiac monitor, continuous pulse ox and blood pressure Approach: midline Location: L3-4 Injection technique: single-shot Needle Needle type: Sprotte and Tuohy  Needle gauge: 24 G Needle length: 9 cm Needle insertion depth: 7 cm Assessment Sensory level: T4 Additional Notes Attempts x 2 per L [place] at 2 puncture sites with sprotte 24GA K Reya Aurich to assist because of large fat pad midline at level for SAB  Switched to Touhy with 7 cm LOR air + CSF clear and injected easily.......... 2cc heavy .75% Marcaine Pt LLD x 3 min

## 2016-11-28 NOTE — Anesthesia Preprocedure Evaluation (Addendum)
Anesthesia Evaluation  Patient identified by MRN, date of birth, ID band Patient awake    Reviewed: Allergy & Precautions, NPO status , Patient's Chart, lab work & pertinent test results  Airway Mallampati: II  TM Distance: >3 FB Neck ROM: Full    Dental no notable dental hx.    Pulmonary neg pulmonary ROS, former smoker,    Pulmonary exam normal breath sounds clear to auscultation       Cardiovascular negative cardio ROS Normal cardiovascular exam Rhythm:Regular Rate:Normal     Neuro/Psych negative neurological ROS  negative psych ROS   GI/Hepatic negative GI ROS, Neg liver ROS,   Endo/Other  negative endocrine ROS  Renal/GU negative Renal ROS  negative genitourinary   Musculoskeletal negative musculoskeletal ROS (+)   Abdominal   Peds negative pediatric ROS (+)  Hematology negative hematology ROS (+)   Anesthesia Other Findings   Reproductive/Obstetrics negative OB ROS                             Anesthesia Physical Anesthesia Plan  ASA: II  Anesthesia Plan: Spinal   Post-op Pain Management:    Induction: Intravenous  Airway Management Planned: Simple Face Mask  Additional Equipment:   Intra-op Plan:   Post-operative Plan:   Informed Consent: I have reviewed the patients History and Physical, chart, labs and discussed the procedure including the risks, benefits and alternatives for the proposed anesthesia with the patient or authorized representative who has indicated his/her understanding and acceptance.   Dental advisory given  Plan Discussed with: CRNA and Surgeon  Anesthesia Plan Comments:         Anesthesia Quick Evaluation

## 2016-11-29 LAB — CBC
HEMATOCRIT: 29.5 % — AB (ref 36.0–46.0)
HEMOGLOBIN: 9.7 g/dL — AB (ref 12.0–15.0)
MCH: 31.9 pg (ref 26.0–34.0)
MCHC: 32.9 g/dL (ref 30.0–36.0)
MCV: 97 fL (ref 78.0–100.0)
Platelets: 192 10*3/uL (ref 150–400)
RBC: 3.04 MIL/uL — AB (ref 3.87–5.11)
RDW: 12.9 % (ref 11.5–15.5)
WBC: 12.5 10*3/uL — AB (ref 4.0–10.5)

## 2016-11-29 LAB — BASIC METABOLIC PANEL
ANION GAP: 5 (ref 5–15)
BUN: 15 mg/dL (ref 6–20)
CALCIUM: 8.4 mg/dL — AB (ref 8.9–10.3)
CO2: 27 mmol/L (ref 22–32)
Chloride: 106 mmol/L (ref 101–111)
Creatinine, Ser: 0.91 mg/dL (ref 0.44–1.00)
GFR calc non Af Amer: >60 mL/min (ref >60)
Glucose, Bld: 117 mg/dL — ABNORMAL HIGH (ref 65–99)
POTASSIUM: 4.1 mmol/L (ref 3.5–5.1)
Sodium: 138 mmol/L (ref 135–145)

## 2016-11-29 MED ORDER — OXYCODONE HCL 5 MG PO TABS: 5.0000 mg | ORAL_TABLET | ORAL | 0 refills | Status: DC | PRN

## 2016-11-29 MED ORDER — NON FORMULARY: 20.0000 mg | Freq: Every day | Status: DC

## 2016-11-29 MED ORDER — METHOCARBAMOL 500 MG PO TABS: 500.0000 mg | ORAL_TABLET | Freq: Four times a day (QID) | ORAL | 0 refills | Status: DC | PRN

## 2016-11-29 MED ORDER — RIVAROXABAN 10 MG PO TABS: 10.0000 mg | ORAL_TABLET | Freq: Every day | ORAL | 0 refills | Status: DC

## 2016-11-29 MED ORDER — ESOMEPRAZOLE MAGNESIUM 20 MG PO CPDR
20.0000 mg | DELAYED_RELEASE_CAPSULE | Freq: Every day | ORAL | Status: DC
  Administered 2016-11-29 – 2016-11-30 (×2): 20 mg via ORAL
  Filled 2016-11-29 (×2): qty 1

## 2016-11-29 MED ORDER — SODIUM CHLORIDE 0.9 % IV BOLUS (SEPSIS)
250.0000 mL | Freq: Once | INTRAVENOUS | Status: AC
  Administered 2016-11-29: 250 mL via INTRAVENOUS

## 2016-11-29 MED FILL — acetaZOLAMIDE 125 MG TABS: 125 | 10 days supply | Qty: 20 | Fill #0 | Status: TO

## 2016-11-29 NOTE — Evaluation (Signed)
Occupational Therapy Evaluation Patient Details Name: Angelica Guerrero MRN: [id number] DOB: 1946/07/05 Today's Date: 11/29/2016    History of Present Illness L TKA, h/o R TKA   Clinical Impression   OT education complete. Pt does not need any DME    Follow Up Recommendations  No OT follow up;Supervision - Intermittent    Equipment Recommendations  None recommended by OT    Recommendations for Other Services       Precautions / Restrictions Precautions Precautions: Fall;Knee Precaution Comments: reviewed no pillow under L knee Required Braces or Orthoses: Knee Immobilizer - Left Knee Immobilizer - Left: Discontinue once straight leg raise with < 10 degree lag Restrictions Weight Bearing Restrictions: No Other Position/Activity Restrictions: WBAT       Mobility Bed Mobility Overal bed mobility: Needs Assistance Bed Mobility: Supine to Sit     Supine to sit: Min guard     General bed mobility comments: assist L LE and increased time  Transfers Overall transfer level: Needs assistance Equipment used: Rolling walker (2 wheeled) Transfers: Sit to/from [facility] Sit to Stand: Min guard Stand pivot transfers: Min guard       General transfer comment: 50% VC's on proper hand placement esp with stand to sit to complete turns and control decend.  Pt impulsive.           ADL Overall ADL's : Needs assistance/impaired     Grooming: Supervision/safety;Standing;Cueing for safety   Upper Body Bathing: Supervision/ safety;Sitting   Lower Body Bathing: Supervison/ safety;Sit to/from stand;Cueing for safety;Cueing for sequencing   Upper Body Dressing : Supervision/safety;Sitting   Lower Body Dressing: Supervision/safety;Sit to/from stand;Cueing for safety;Cueing for sequencing   Toilet Transfer: Supervision/safety;RW;Ambulation;Cueing for sequencing   Toileting- Clothing Manipulation and Hygiene: Sit to/from stand;Cueing for sequencing    Tub/ Shower Transfer: Copy Details (indicate cue type and reason): verbalized safety    General ADL Comments: pt moves quickly and is impulsive with ADL activity such as walking to bathroom.  Overall S with VC to take her time and slow down               Pertinent Vitals/Pain Pain Assessment: 0-10 Pain Score: 5  Pain Location: L knee Pain Descriptors / Indicators: Discomfort;Grimacing;Operative site guarding Pain Intervention(s): Monitored during session;Repositioned;Ice applied     Hand Dominance     Extremity/Trunk Assessment Upper Extremity Assessment Upper Extremity Assessment: Overall WFL for tasks assessed           Communication Communication Communication: No difficulties   Cognition Arousal/Alertness: Awake/alert Behavior During Therapy: WFL for tasks assessed/performed Overall Cognitive Status: Within Functional Limits for tasks assessed                                Home Living Family/patient expects to be discharged to:: Private residence Living Arrangements: Spouse/significant other Available Help at Discharge: Family;Available 24 hours/day Type of Home: House Home Access: Stairs to enter [employer] of Steps: 5 Entrance Stairs-Rails: Left Home Layout: Two level;Able to live on main level with bedroom/bathroom     Bathroom Shower/Tub: Occupational psychologist: Standard     Home Equipment: Environmental consultant - 2 wheels;Bedside commode          Prior Functioning/Environment Level of Independence: Independent with assistive device(s)        Comments: used RW prn        OT Problem List:  OT Treatment/Interventions:      OT Goals(Current goals can be found in the care plan section) Acute Rehab OT Goals Patient Stated Goal: work out at gym, play golf OT Goal Formulation: With patient Potential to Achieve Goals: Good  OT Frequency:                End of Session Equipment  Utilized During Treatment: Rolling walker Nurse Communication: Mobility status  Activity Tolerance: Patient tolerated treatment well Patient left: in chair;with call bell/phone within reach;with chair alarm set   Time: 1225-1245 OT Time Calculation (min): 20 min Charges:  OT General Charges $OT Visit: 1 Procedure OT Evaluation $OT Eval Low Complexity: 1 Procedure G-Codes:    Payton Mccallum D Dec 27, 2016, 1:11 PM

## 2016-11-29 NOTE — Progress Notes (Signed)
   Subjective: 1 Day Post-Op Procedure(s) (LRB): LEFT TOTAL KNEE ARTHROPLASTY (Left) Patient reports pain as mild and moderate.   Patient seen in rounds for Dr. Wynelle Link. Patient is well, but has had some minor complaints of pain in the knee, requiring pain medications We will start therapy today.  If they do well with therapy and meets all goals, then will allow home later this afternoon following therapy if she feels good.  Dr. Wynelle Link to come by later today. Plan is to go Home after hospital stay.  Objective: Vital signs in last 24 hours: Temp:  [97.2 F (36.2 C)-99.5 F (37.5 C)] 99.4 F (37.4 C) (02/13 0650) Pulse Rate:  [59-78] 68 (02/13 0650) Resp:  [10-21] 16 (02/13 0650) BP: (109-160)/(52-107) 129/68 (02/13 0650) SpO2:  [95 %-100 %] 99 % (02/13 0650) Weight:  [67.1 kg (148 lb)] 67.1 kg (148 lb) (02/12 1055)  Intake/Output from previous day:  Intake/Output Summary (Last 24 hours) at 11/29/16 0819 Last data filed at 11/29/16 0650  Gross per 24 hour  Intake           1817.5 ml  Output             2835 ml  Net          -1017.5 ml    Intake/Output this shift: No intake/output data recorded.  Labs:  Recent Labs  11/29/16 0425  HGB 9.7*    Recent Labs  11/29/16 0425  WBC 12.5*  RBC 3.04*  HCT 29.5*  PLT 192    Recent Labs  11/29/16 0425  NA 138  K 4.1  CL 106  CO2 27  BUN 15  CREATININE 0.91  GLUCOSE 117*  CALCIUM 8.4*   No results for input(s): LABPT, INR in the last 72 hours.  EXAM General - Patient is Alert, Appropriate and Oriented Extremity - Neurovascular intact Sensation intact distally Dorsiflexion/Plantar flexion intact Dressing - dressing C/D/I Motor Function - intact, moving foot and toes well on exam.  Hemovac pulled without difficulty.  Past Medical History:  Diagnosis Date  . Aortic valve disorders    MILD MVP  . Depression   . External hemorrhoids 1969  . GERD (gastroesophageal reflux disease)   . Hyperlipemia   .  Migraine, unspecified, without mention of intractable migraine without mention of status migrainosus    NONE IN YEARS  . Obstructive sleep apnea   . Osteoarthrosis, unspecified whether generalized or localized, unspecified site   . Other and unspecified hyperlipidemia   . Scab    RIGHT LOWER ARM HEALING FOR LAST 2 WEEKS  . Snoring   . Status post placement of implantable loop recorder    NO LONGER USING    Assessment/Plan: 1 Day Post-Op Procedure(s) (LRB): LEFT TOTAL KNEE ARTHROPLASTY (Left) Principal Problem:   OA (osteoarthritis) of knee  Estimated body mass index is 24.63 kg/m as calculated from the following:   Height as of this encounter: 5\' 5"  (1.651 m).   Weight as of this encounter: 67.1 kg (148 lb). Up with therapy Discharge home - straight to outpatient therapy  DVT Prophylaxis - Xarelto Weight-Bearing as tolerated to left leg D/C O2 and Pulse OX and try on Room Air  If meets goals and able to go home: Diet - Cardiac diet Follow up - in 2 weeks Activity - WBAT Disposition - Home Condition Upon Discharge - pending D/C Meds - See DC Summary DVT Prophylaxis - [place], PA-C Orthopaedic Surgery

## 2016-11-29 NOTE — Progress Notes (Signed)
Physical Therapy Treatment Patient Details Name: CIRE GALAN MRN: [id number] DOB: May 20, 1946 Today's Date: 11/29/2016    History of Present Illness L TKA, h/o R TKA    PT Comments    POD # 1 am session Assisted OOB to amb to bathroom to void (225 reported to NT) then amb in hallway.  Returned to room then performed some TKR TE's followed by ICE.   Follow Up Recommendations  Outpatient PT     Equipment Recommendations  None recommended by PT    Recommendations for Other Services       Precautions / Restrictions Precautions Precautions: Fall;Knee Precaution Comments: reviewed no pillow under L knee Required Braces or Orthoses: Knee Immobilizer - Left Knee Immobilizer - Left: Discontinue once straight leg raise with < 10 degree lag Restrictions Weight Bearing Restrictions: No Other Position/Activity Restrictions: WBAT     Mobility  Bed Mobility Overal bed mobility: Needs Assistance Bed Mobility: Supine to Sit     Supine to sit: Min guard     General bed mobility comments: assist L LE and increased time  Transfers Overall transfer level: Needs assistance Equipment used: Rolling walker (2 wheeled) Transfers: Sit to/from [facility] Sit to Stand: Min guard Stand pivot transfers: Min guard       General transfer comment: 50% VC's on proper hand placement esp with stand to sit to complete turns and control decend.  Pt impulsive.    Ambulation/Gait Ambulation/Gait assistance: Min guard;Min assist Ambulation Distance (Feet): 54 Feet Assistive device: Rolling walker (2 wheeled)       General Gait Details: 50% VC's to decrease gait speed, increase WB thru L LE and proper walker to self distance esp with turns.     Stairs            Wheelchair Mobility    Modified Rankin (Stroke Patients Only)       Balance                                    Cognition Arousal/Alertness: Awake/alert Behavior During Therapy:  WFL for tasks assessed/performed Overall Cognitive Status: Within Functional Limits for tasks assessed                      Exercises   Total Knee Replacement TE's 10 reps B LE ankle pumps 10 reps towel squeezes 10 reps knee presses 10 reps heel slides AAROM with sheet  Followed by ICE     General Comments        Pertinent Vitals/Pain Pain Assessment: 0-10 Pain Score: 5  Pain Location: L knee Pain Descriptors / Indicators: Discomfort;Grimacing;Operative site guarding Pain Intervention(s): Monitored during session;Repositioned;Ice applied    Home Living Family/patient expects to be discharged to:: Private residence Living Arrangements: Spouse/significant other Available Help at Discharge: Family;Available 24 hours/day Type of Home: House Home Access: Stairs to enter Entrance Stairs-Rails: Left Home Layout: Two level;Able to live on main level with bedroom/bathroom Home Equipment: Gilford Rile - 2 wheels;Bedside commode      Prior Function Level of Independence: Independent with assistive device(s)      Comments: used RW prn   PT Goals (current goals can now be found in the care plan section) Progress towards PT goals: Progressing toward goals    Frequency    7X/week      PT Plan Current plan remains appropriate    Co-evaluation  End of Session Equipment Utilized During Treatment: Gait belt Activity Tolerance: Patient tolerated treatment well Patient left: in chair;with call bell/phone within reach;with chair alarm set     Time: [id number] PT Time Calculation (min) (ACUTE ONLY): 28 min  Charges:  $Gait Training: 8-22 mins $Therapeutic Exercise: 8-22 mins                    G Codes:      Rica Koyanagi  PTA [facility]  Acute  Rehab Pager      [phone]

## 2016-11-29 NOTE — Progress Notes (Signed)
PT Cancellation Note  Patient Details Name: THANH LOEW MRN: [id number] DOB: [birth year]   Cancelled Treatment:     pt declined pm session due to pain level and requested to rest.  Pt plans to stay another night so will address stair training tomorrow before D/C.  Applied fresh  ICE to knee.   Rica Koyanagi  PTA [facility]  Acute  Rehab Pager      [phone]

## 2016-11-29 NOTE — Discharge Instructions (Addendum)
° °Dr. Frank Aluisio °Total Joint Specialist °[facility] °[address] °[place], [place] [address] °[phone] ° °TOTAL KNEE REPLACEMENT POSTOPERATIVE DIRECTIONS ° °Knee Rehabilitation, Guidelines Following Surgery  °Results after knee surgery are often greatly improved when you follow the exercise, range of motion and muscle strengthening exercises prescribed by your doctor. Safety measures are also important to protect the knee from further injury. Any time any of these exercises cause you to have increased pain or swelling in your knee joint, decrease the amount until you are comfortable again and slowly increase them. If you have problems or questions, call your caregiver or physical therapist for advice.  ° °HOME CARE INSTRUCTIONS  °Remove items at home which could result in a fall. This includes throw rugs or furniture in walking pathways.  °· ICE to the affected knee every three hours for 30 minutes at a time and then as needed for pain and swelling.  Continue to use ice on the knee for pain and swelling from surgery. You may notice swelling that will progress down to the foot and ankle.  This is normal after surgery.  Elevate the leg when you are not up walking on it.   °· Continue to use the breathing machine which will help keep your temperature down.  It is common for your temperature to cycle up and down following surgery, especially at night when you are not up moving around and exerting yourself.  The breathing machine keeps your lungs expanded and your temperature down. °· Do not place pillow under knee, focus on keeping the knee straight while resting ° °DIET °You may resume your previous home diet once your are discharged from the hospital. ° °DRESSING / WOUND CARE / SHOWERING °You may shower 3 days after surgery, but keep the wounds dry during showering.  You may use an occlusive plastic wrap (Press'n Seal for example), NO SOAKING/SUBMERGING IN THE BATHTUB.  If the  bandage gets wet, change with a clean dry gauze.  If the incision gets wet, pat the wound dry with a clean towel. °You may start showering once you are discharged home but do not submerge the incision under water. Just pat the incision dry and apply a dry gauze dressing on daily. °Change the surgical dressing daily and reapply a dry dressing each time. ° °ACTIVITY °Walk with your walker as instructed. °Use walker as long as suggested by your caregivers. °Avoid periods of inactivity such as sitting longer than an hour when not asleep. This helps prevent blood clots.  °You may resume a sexual relationship in one month or when given the OK by your doctor.  °You may return to work once you are cleared by your doctor.  °Do not drive a car for 6 weeks or until released by you surgeon.  °Do not drive while taking narcotics. ° °WEIGHT BEARING °Weight bearing as tolerated with assist device (walker, cane, etc) as directed, use it as long as suggested by your surgeon or therapist, typically at least 4-6 weeks. ° °POSTOPERATIVE CONSTIPATION PROTOCOL °Constipation - defined medically as fewer than three stools per week and severe constipation as less than one stool per week. ° °One of the most common issues patients have following surgery is constipation.  Even if you have a regular bowel pattern at home, your normal regimen is likely to be disrupted due to multiple reasons following surgery.  Combination of anesthesia, postoperative narcotics, change in appetite and fluid intake all can affect your bowels.    In order to avoid complications following surgery, here are some recommendations in order to help you during your recovery period. ° °Colace (docusate) - Pick up an over-the-counter form of Colace or another stool softener and take twice a day as long as you are requiring postoperative pain medications.  Take with a full glass of water daily.  If you experience loose stools or diarrhea, hold the colace until you stool forms  back up.  If your symptoms do not get better within 1 week or if they get worse, check with your doctor. ° °Dulcolax (bisacodyl) - Pick up over-the-counter and take as directed by the product packaging as needed to assist with the movement of your bowels.  Take with a full glass of water.  Use this product as needed if not relieved by Colace only.  ° °MiraLax (polyethylene glycol) - Pick up over-the-counter to have on hand.  MiraLax is a solution that will increase the amount of water in your bowels to assist with bowel movements.  Take as directed and can mix with a glass of water, juice, soda, coffee, or tea.  Take if you go more than two days without a movement. °Do not use MiraLax more than once per day. Call your doctor if you are still constipated or irregular after using this medication for 7 days in a row. ° °If you continue to have problems with postoperative constipation, please contact the office for further assistance and recommendations.  If you experience "the worst abdominal pain ever" or develop nausea or vomiting, please contact the office immediatly for further recommendations for treatment. ° °ITCHING ° If you experience itching with your medications, try taking only a single pain pill, or even half a pain pill at a time.  You can also use Benadryl over the counter for itching or also to help with sleep.  ° °TED HOSE STOCKINGS °Wear the elastic stockings on both legs for three weeks following surgery during the day but you may remove then at night for sleeping. ° °MEDICATIONS °See your medication summary on the “After Visit Summary” that the nursing staff will review with you prior to discharge.  You may have some home medications which will be placed on hold until you complete the course of blood thinner medication.  It is important for you to complete the blood thinner medication as prescribed by your surgeon.  Continue your approved medications as instructed at time of  discharge. ° °PRECAUTIONS °If you experience chest pain or shortness of breath - call 911 immediately for transfer to the hospital emergency department.  °If you develop a fever greater that 101 F, purulent drainage from wound, increased redness or drainage from wound, foul odor from the wound/dressing, or calf pain - CONTACT YOUR SURGEON.   °                                                °FOLLOW-UP APPOINTMENTS °Make sure you keep all of your appointments after your operation with your surgeon and caregivers. You should call the office at the above phone number and make an appointment for approximately two weeks after the date of your surgery or on the date instructed by your surgeon outlined in the "After Visit Summary". ° ° °RANGE OF MOTION AND STRENGTHENING EXERCISES  °Rehabilitation of the knee is important following a knee injury or   an operation. After just a few days of immobilization, the muscles of the thigh which control the knee become weakened and shrink (atrophy). Knee exercises are designed to build up the tone and strength of the thigh muscles and to improve knee motion. Often times heat used for twenty to thirty minutes before working out will loosen up your tissues and help with improving the range of motion but do not use heat for the first two weeks following surgery. These exercises can be done on a training (exercise) mat, on the floor, on a table or on a bed. Use what ever works the best and is most comfortable for you Knee exercises include:  Leg Lifts - While your knee is still immobilized in a splint or cast, you can do straight leg raises. Lift the leg to 60 degrees, hold for 3 sec, and slowly lower the leg. Repeat 10-20 times 2-3 times daily. Perform this exercise against resistance later as your knee gets better.  Quad and Hamstring Sets - Tighten up the muscle on the front of the thigh (Quad) and hold for 5-10 sec. Repeat this 10-20 times hourly. Hamstring sets are done by pushing the  foot backward against an object and holding for 5-10 sec. Repeat as with quad sets.   Leg Slides: Lying on your back, slowly slide your foot toward your buttocks, bending your knee up off the floor (only go as far as is comfortable). Then slowly slide your foot back down until your leg is flat on the floor again.  Angel Wings: Lying on your back spread your legs to the side as far apart as you can without causing discomfort.  A rehabilitation program following serious knee injuries can speed recovery and prevent re-injury in the future due to weakened muscles. Contact your doctor or a physical therapist for more information on knee rehabilitation.   IF YOU ARE TRANSFERRED TO A SKILLED REHAB FACILITY If the patient is transferred to a skilled rehab facility following release from the hospital, a list of the current medications will be sent to the facility for the patient to continue.  When discharged from the skilled rehab facility, please have the facility set up the patient's [place] prior to being released. Also, the skilled facility will be responsible for providing the patient with their medications at time of release from the facility to include their pain medication, the muscle relaxants, and their blood thinner medication. If the patient is still at the rehab facility at time of the two week follow up appointment, the skilled rehab facility will also need to assist the patient in arranging follow up appointment in our office and any transportation needs.  MAKE SURE YOU:  Understand these instructions.  Get help right away if you are not doing well or get worse.    Pick up stool softner and laxative for home use following surgery while on pain medications. Do not submerge incision under water. Please use good hand washing techniques while changing dressing each day. May shower starting three days after surgery. Please use a clean towel to pat the incision dry following  showers. Continue to use ice for pain and swelling after surgery. Do not use any lotions or creams on the incision until instructed by your surgeon.  Take Xarelto for two and a half more weeks following discharge from the hospital, then discontinue Xarelto. Once the patient has completed the Xarelto, they may resume the 81 mg Aspirin.   Information on my  medicine - XARELTO (Rivaroxaban)  This medication education was reviewed with me or my healthcare representative as part of my discharge preparation.  The pharmacist that spoke with me during my hospital stay was:  Hershal Coria, [facility]  Why was Xarelto prescribed for you? Xarelto was prescribed for you to reduce the risk of blood clots forming after orthopedic surgery. The medical term for these abnormal blood clots is venous thromboembolism (VTE).  What do you need to know about xarelto ? Take your Xarelto ONCE DAILY at the same time every day. You may take it either with or without food.  If you have difficulty swallowing the tablet whole, you may crush it and mix in applesauce just prior to taking your dose.  Take Xarelto exactly as prescribed by your doctor and DO NOT stop taking Xarelto without talking to the doctor who prescribed the medication.  Stopping without other VTE prevention medication to take the place of Xarelto may increase your risk of developing a clot.  After discharge, you should have regular check-up appointments with your healthcare provider that is prescribing your Xarelto.    What do you do if you miss a dose? If you miss a dose, take it as soon as you remember on the same day then continue your regularly scheduled once daily regimen the next day. Do not take two doses of Xarelto on the same day.   Important Safety Information A possible side effect of Xarelto is bleeding. You should call your healthcare provider right away if you experience any of the following: ? Bleeding from an injury or your nose  that does not stop. ? Unusual colored urine (red or dark brown) or unusual colored stools (red or black). ? Unusual bruising for unknown reasons. ? A serious fall or if you hit your head (even if there is no bleeding).  Some medicines may interact with Xarelto and might increase your risk of bleeding while on Xarelto. To help avoid this, consult your healthcare provider or pharmacist prior to using any new prescription or non-prescription medications, including herbals, vitamins, non-steroidal anti-inflammatory drugs (NSAIDs) and supplements.  This website has more information on Xarelto: [email or web address]

## 2016-11-29 NOTE — Discharge Summary (Signed)
Physician Discharge Summary   Patient ID: Angelica Guerrero MRN: [id number] DOB/AGE: 71/20/1947 71 y.o.  Admit date: 11/28/2016 Discharge date: 11-30-2016  Primary Diagnosis:  Osteoarthritis  Left  knee(s)  Admission Diagnoses:  Past Medical History:  Diagnosis Date  . Aortic valve disorders    MILD MVP  . Depression   . External hemorrhoids 1969  . GERD (gastroesophageal reflux disease)   . Hyperlipemia   . Migraine, unspecified, without mention of intractable migraine without mention of status migrainosus    NONE IN YEARS  . Obstructive sleep apnea   . Osteoarthrosis, unspecified whether generalized or localized, unspecified site   . Other and unspecified hyperlipidemia   . Scab    RIGHT LOWER ARM HEALING FOR LAST 2 WEEKS  . Snoring   . Status post placement of implantable loop recorder    NO LONGER USING   Discharge Diagnoses:   Principal Problem:   OA (osteoarthritis) of knee  Estimated body mass index is 24.63 kg/m as calculated from the following:   Height as of this encounter: _0  (1.651 m).   Weight as of this encounter: 67.1 kg (148 lb).  Procedure:  Procedure(s) (LRB): LEFT TOTAL KNEE ARTHROPLASTY (Left)   Consults: None  HPI: Angelica Guerrero is a 71 y.o. year old female with end stage OA of her left knee with progressively worsening pain and dysfunction. She has constant pain, with activity and at rest and significant functional deficits with difficulties even with ADLs. She has had extensive non-op management including analgesics, injections of cortisone and viscosupplements, and home exercise program, but remains in significant pain with significant dysfunction. Radiographs show bone on bone arthritis medial and patellofemoral. She presents now for left Total Knee Arthroplasty.    Laboratory Data: Admission on 11/28/2016  Component Date Value Ref Range Status  . WBC 11/29/2016 12.5* 4.0 - 10.5 K/uL Final  . RBC 11/29/2016 3.04* 3.87 - 5.11 MIL/uL  Final  . Hemoglobin 11/29/2016 9.7* 12.0 - 15.0 g/dL Final  . HCT 11/29/2016 29.5* 36.0 - 46.0 % Final  . MCV 11/29/2016 97.0  78.0 - 100.0 fL Final  . MCH 11/29/2016 31.9  26.0 - 34.0 pg Final  . MCHC 11/29/2016 32.9  30.0 - 36.0 g/dL Final  . RDW 11/29/2016 12.9  11.5 - 15.5 % Final  . Platelets 11/29/2016 192  150 - 400 K/uL Final  . Sodium 11/29/2016 138  135 - 145 mmol/L Final  . Potassium 11/29/2016 4.1  3.5 - 5.1 mmol/L Final  . Chloride 11/29/2016 106  101 - 111 mmol/L Final  . CO2 11/29/2016 27  22 - 32 mmol/L Final  . Glucose, Bld 11/29/2016 117* 65 - 99 mg/dL Final  . BUN 11/29/2016 15  6 - 20 mg/dL Final  . Creatinine, Ser 11/29/2016 0.91  0.44 - 1.00 mg/dL Final  . Calcium 11/29/2016 8.4* 8.9 - 10.3 mg/dL Final  . GFR calc non Af Amer 11/29/2016 >60  >60 mL/min Final  . GFR calc Af Amer 11/29/2016 >60  >60 mL/min Final   Comment: (NOTE) The eGFR has been calculated using the CKD EPI equation. This calculation has not been validated in all clinical situations. eGFR's persistently <60 mL/min signify possible Chronic Kidney Disease.   Georgiann Hahn gap 11/29/2016 5  5 - 15 Final  Hospital Outpatient Visit on 11/23/2016  Component Date Value Ref Range Status  . aPTT 11/23/2016 24  24 - 36 seconds Final  . WBC 11/23/2016 6.5  4.0 - 10.5  K/uL Final  . RBC 11/23/2016 4.40  3.87 - 5.11 MIL/uL Final  . Hemoglobin 11/23/2016 14.1  12.0 - 15.0 g/dL Final  . HCT 11/23/2016 43.4  36.0 - 46.0 % Final  . MCV 11/23/2016 98.6  78.0 - 100.0 fL Final  . MCH 11/23/2016 32.0  26.0 - 34.0 pg Final  . MCHC 11/23/2016 32.5  30.0 - 36.0 g/dL Final  . RDW 11/23/2016 13.1  11.5 - 15.5 % Final  . Platelets 11/23/2016 237  150 - 400 K/uL Final  . Sodium 11/23/2016 138  135 - 145 mmol/L Final  . Potassium 11/23/2016 4.2  3.5 - 5.1 mmol/L Final  . Chloride 11/23/2016 103  101 - 111 mmol/L Final  . CO2 11/23/2016 27  22 - 32 mmol/L Final  . Glucose, Bld 11/23/2016 102* 65 - 99 mg/dL Final  . BUN  11/23/2016 19  6 - 20 mg/dL Final  . Creatinine, Ser 11/23/2016 0.86  0.44 - 1.00 mg/dL Final  . Calcium 11/23/2016 9.9  8.9 - 10.3 mg/dL Final  . Total Protein 11/23/2016 7.7  6.5 - 8.1 g/dL Final  . Albumin 11/23/2016 4.3  3.5 - 5.0 g/dL Final  . AST 11/23/2016 26  15 - 41 U/L Final  . ALT 11/23/2016 22  14 - 54 U/L Final  . Alkaline Phosphatase 11/23/2016 53  38 - 126 U/L Final  . Total Bilirubin 11/23/2016 0.8  0.3 - 1.2 mg/dL Final  . GFR calc non Af Amer 11/23/2016 >60  >60 mL/min Final  . GFR calc Af Amer 11/23/2016 >60  >60 mL/min Final   Comment: (NOTE) The eGFR has been calculated using the CKD EPI equation. This calculation has not been validated in all clinical situations. eGFR's persistently <60 mL/min signify possible Chronic Kidney Disease.   . Anion gap 11/23/2016 8  5 - 15 Final  . Prothrombin Time 11/23/2016 12.7  11.4 - 15.2 seconds Final  . INR 11/23/2016 0.95   Final  . ABO/RH(D) 11/23/2016 A POS   Final  . Antibody Screen 11/23/2016 NEG   Final  . Sample Expiration 11/23/2016 12/01/2016   Final  . Extend sample reason 11/23/2016 NO TRANSFUSIONS OR PREGNANCY IN THE PAST 3 MONTHS   Final  . MRSA, PCR 11/23/2016 NEGATIVE  NEGATIVE Final  . Staphylococcus aureus 11/23/2016 NEGATIVE  NEGATIVE Final   Comment:        The Xpert SA Assay (FDA approved for NASAL specimens in patients over 29 years of age), is one component of a comprehensive surveillance program.  Test performance has been validated by [facility] for patients greater than or equal to 52 year old. It is not intended to diagnose infection nor to guide or monitor treatment.      X-Rays:No results found.  EKG: Orders placed or performed during the hospital encounter of 11/23/16  . EKG 12 lead  . EKG 12 lead     Hospital Course: Angelica Guerrero is a 71 y.o. who was admitted to [facility]. They were brought to the operating room on 11/28/2016 and underwent Procedure(s): LEFT TOTAL  KNEE ARTHROPLASTY.  Patient tolerated the procedure well and was later transferred to the recovery room and then to the orthopaedic floor for postoperative care.  They were given PO and IV analgesics for pain control following their surgery.  They were given 24 hours of postoperative antibiotics of  Anti-infectives    Start     Dose/Rate Route Frequency Ordered Stop   11/28/16  1400  ceFAZolin (ANCEF) IVPB 2g/100 mL premix     2 g 200 mL/hr over 30 Minutes Intravenous Every 6 hours 11/28/16 1058 11/28/16 2018   11/28/16 0514  ceFAZolin (ANCEF) IVPB 2g/100 mL premix     2 g 200 mL/hr over 30 Minutes Intravenous On call to O.R. 11/28/16 7628 11/28/16 0734     and started on DVT prophylaxis in the form of Xarelto.   PT and OT were ordered for total joint protocol.  Discharge planning consulted to help with postop disposition and equipment needs.  Patient had a decent night on the evening of surgery.  They started to get up OOB with therapy on day one. Hemovac drain was pulled without difficulty.  Continued to work with therapy into day two.  Dressing was changed on day two and the incision was healing well.  Patient was seen in rounds and was ready to go home.  Diet - Cardiac diet Follow up - in 2 weeks Activity - WBAT Disposition - Home Condition Upon Discharge - Good D/C Meds - See DC Summary DVT Prophylaxis - Xarelto   Discharge Instructions    Call MD / Call 911    Complete by:  As directed    If you experience chest pain or shortness of breath, CALL 911 and be transported to the hospital emergency room.  If you develope a fever above 101 F, pus (white drainage) or increased drainage or redness at the wound, or calf pain, call your surgeon's office.   Change dressing    Complete by:  As directed    Change dressing daily with sterile 4 x 4 inch gauze dressing and apply TED hose. Do not submerge the incision under water.   Constipation Prevention    Complete by:  As directed    Drink  plenty of fluids.  Prune juice may be helpful.  You may use a stool softener, such as Colace (over the counter) 100 mg twice a day.  Use MiraLax (over the counter) for constipation as needed.   Diet - low sodium heart healthy    Complete by:  As directed    Discharge instructions    Complete by:  As directed    Pick up stool softner and laxative for home use following surgery while on pain medications. Do not submerge incision under water. Please use good hand washing techniques while changing dressing each day. May shower starting three days after surgery. Please use a clean towel to pat the incision dry following showers. Continue to use ice for pain and swelling after surgery. Do not use any lotions or creams on the incision until instructed by your surgeon.  Wear both TED hose on both legs during the day every day for three weeks, but may have off at night at home.  Postoperative Constipation Protocol  Constipation - defined medically as fewer than three stools per week and severe constipation as less than one stool per week.  One of the most common issues patients have following surgery is constipation.  Even if you have a regular bowel pattern at home, your normal regimen is likely to be disrupted due to multiple reasons following surgery.  Combination of anesthesia, postoperative narcotics, change in appetite and fluid intake all can affect your bowels.  In order to avoid complications following surgery, here are some recommendations in order to help you during your recovery period.  Colace (docusate) - Pick up an over-the-counter form of Colace or another stool softener and  take twice a day as long as you are requiring postoperative pain medications.  Take with a full glass of water daily.  If you experience loose stools or diarrhea, hold the colace until you stool forms back up.  If your symptoms do not get better within 1 week or if they get worse, check with your doctor.  Dulcolax  (bisacodyl) - Pick up over-the-counter and take as directed by the product packaging as needed to assist with the movement of your bowels.  Take with a full glass of water.  Use this product as needed if not relieved by Colace only.   MiraLax (polyethylene glycol) - Pick up over-the-counter to have on hand.  MiraLax is a solution that will increase the amount of water in your bowels to assist with bowel movements.  Take as directed and can mix with a glass of water, juice, soda, coffee, or tea.  Take if you go more than two days without a movement. Do not use MiraLax more than once per day. Call your doctor if you are still constipated or irregular after using this medication for 7 days in a row.  If you continue to have problems with postoperative constipation, please contact the office for further assistance and recommendations.  If you experience "the worst abdominal pain ever" or develop nausea or vomiting, please contact the office immediatly for further recommendations for treatment.   Take Xarelto for two and a half more weeks, then discontinue Xarelto. Once the patient has completed the Xarelto, they may resume the 81 mg Aspirin.   Do not put a pillow under the knee. Place it under the heel.    Complete by:  As directed    Do not sit on low chairs, stoools or toilet seats, as it may be difficult to get up from low surfaces    Complete by:  As directed    Driving restrictions    Complete by:  As directed    No driving until released by the physician.   Increase activity slowly as tolerated    Complete by:  As directed    Lifting restrictions    Complete by:  As directed    No lifting until released by the physician.   Patient may shower    Complete by:  As directed    You may shower without a dressing once there is no drainage.  Do not wash over the wound.  If drainage remains, do not shower until drainage stops.   TED hose    Complete by:  As directed    Use stockings (TED hose)  for 3 weeks on both leg(s).  You may remove them at night for sleeping.   Weight bearing as tolerated    Complete by:  As directed    Laterality:  left   Extremity:  Lower     Allergies as of 11/29/2016      Reactions   Lactose Intolerance (gi) Diarrhea   Morphine And Related Itching         Follow-up Information    [name], MD. Schedule an appointment as soon as possible for a visit on 12/13/2016.   Specialty:  Orthopedic Surgery Contact information: [address] [place] [address] [phone]           Signed: Arlee Muslim, PA-C Orthopaedic Surgery 11/29/2016, 8:30 AM

## 2016-11-29 NOTE — Care Management Note (Signed)
Case Management Note  Patient Details  Name: Angelica Guerrero MRN: [id number] Date of Birth: [birth year]  Subjective/Objective:                  LEFT TOTAL KNEE ARTHROPLASTY (Left) Action/Plan: Discharge planning Expected Discharge Date:  11/29/16               Expected Discharge Plan:  Home/Self Care  In-House Referral:     Discharge planning Services  CM Consult  Post Acute Care Choice:    Choice offered to:  Patient  DME Arranged:  N/A DME Agency:  NA  HH Arranged:  NA HH Agency:  NA  Status of Service:  Completed, signed off  If discussed at [place] of Stay Meetings, dates discussed:    Additional Comments: CM met with pt in room to confirm plan is for outpt PT; pt confirms as does PA.  Pt states she has all DME needed at home. NO other Cm needs were communicated. Dellie Catholic, RN 11/29/2016, 10:18 AM

## 2016-11-30 ENCOUNTER — Encounter (HOSPITAL_COMMUNITY): Payer: Self-pay | Admitting: Orthopedic Surgery

## 2016-11-30 LAB — BASIC METABOLIC PANEL
Anion gap: 6 (ref 5–15)
BUN: 11 mg/dL (ref 6–20)
CALCIUM: 8.6 mg/dL — AB (ref 8.9–10.3)
CO2: 25 mmol/L (ref 22–32)
Chloride: 103 mmol/L (ref 101–111)
Creatinine, Ser: 0.73 mg/dL (ref 0.44–1.00)
GFR calc Af Amer: >60 mL/min (ref >60)
GLUCOSE: 123 mg/dL — AB (ref 65–99)
Potassium: 3.7 mmol/L (ref 3.5–5.1)
SODIUM: 134 mmol/L — AB (ref 135–145)

## 2016-11-30 LAB — CBC
HCT: 26.8 % — ABNORMAL LOW (ref 36.0–46.0)
Hemoglobin: 9 g/dL — ABNORMAL LOW (ref 12.0–15.0)
MCH: 32.4 pg (ref 26.0–34.0)
MCHC: 33.6 g/dL (ref 30.0–36.0)
MCV: 96.4 fL (ref 78.0–100.0)
PLATELETS: 182 10*3/uL (ref 150–400)
RBC: 2.78 MIL/uL — ABNORMAL LOW (ref 3.87–5.11)
RDW: 12.9 % (ref 11.5–15.5)
WBC: 13.8 10*3/uL — AB (ref 4.0–10.5)

## 2016-11-30 MED FILL — XARELTO 10 MG TABLET: 10 | 20 days supply | Qty: 20 | Fill #0

## 2016-11-30 MED FILL — oxyCODONE HCL 5 MG TABS: 5 | 14 days supply | Qty: 80 | Fill #0

## 2016-11-30 NOTE — Progress Notes (Signed)
Pt d/c'd home. AVS reviewed and "My Chart" discussed with pt. Pt capable of verbalizing medications, dressing changes, signs and symptoms of infection, and follow-up appointments. Remains hemodynamically stable. No signs and symptoms of distress. Educated pt to return to ER in the case of SOB, dizziness, or chest pain.  home. Plan is for outpatient PT, which pt reports is scheduled to start Friday. No DME needs.

## 2016-11-30 NOTE — Progress Notes (Signed)
Physical Therapy Treatment Patient Details Name: Angelica Guerrero MRN: [id number] DOB: July 22, 1946 Today's Date: 11/30/2016    History of Present Illness L TKA, h/o R TKA    PT Comments    POD # 2 Spouse present for "hands on" instruction on all listed below. Pt given handout HEP TE's.  Instructed on use of ICE  Follow Up Recommendations  Outpatient PT     Equipment Recommendations  None recommended by PT    Recommendations for Other Services       Precautions / Restrictions Precautions Precautions: Fall;Knee Precaution Comments: reviewed no pillow under L knee as I entered the room there was a pillow under her knee Required Braces or Orthoses: Knee Immobilizer - Left Knee Immobilizer - Left: Discontinue once straight leg raise with < 10 degree lag Restrictions Weight Bearing Restrictions: No Other Position/Activity Restrictions: WBAT     Mobility  Bed Mobility Overal bed mobility: Needs Assistance Bed Mobility: Supine to Sit     Supine to sit: Min guard;Min assist     General bed mobility comments: assist L LE and increased time, had spouse "hands on" assist pt with instruction on proper tech   Transfers Overall transfer level: Needs assistance Equipment used: Rolling walker (2 wheeled) Transfers: Sit to/from [employer] Transfers   Stand pivot transfers: Min guard;Supervision       General transfer comment: 25% VC's on proper tech and safety esp with turns.  Some direction needed to be repeated thru session.    Ambulation/Gait Ambulation/Gait assistance: Supervision;Min guard Ambulation Distance (Feet): 43 Feet Assistive device: Rolling walker (2 wheeled) Gait Pattern/deviations: Step-to pattern;Decreased step length - left;Antalgic     General Gait Details: 25% VC's to decrease gait speed, increase WB thru L LE and proper walker to self distance esp with turns.  Had spouse "hands on" assist pt with therapist direction.    Stairs Stairs:  Yes   Stair Management: One rail Right;Step to pattern;Forwards;With crutches Number of Stairs: 4 General stair comments: with spouse "hands on" assist under therapist direction using one rail and one crutch  Wheelchair Mobility    Modified Rankin (Stroke Patients Only)       Balance                                    Cognition Arousal/Alertness: Awake/alert (slightly confused to new information) Behavior During Therapy: WFL for tasks assessed/performed Overall Cognitive Status: Within Functional Limits for tasks assessed                 General Comments: required repeat instruction on some new information    Exercises      General Comments        Pertinent Vitals/Pain Pain Assessment: 0-10 Pain Score: 3  Pain Location: L knee Pain Descriptors / Indicators: Discomfort;Grimacing;Operative site guarding Pain Intervention(s): Monitored during session;Repositioned;Ice applied    Home Living                      Prior Function            PT Goals (current goals can now be found in the care plan section) Progress towards PT goals: Progressing toward goals    Frequency    7X/week      PT Plan Current plan remains appropriate    Co-evaluation             End of  Session Equipment Utilized During Treatment: Gait belt Activity Tolerance: Patient tolerated treatment well Patient left: in chair;with call bell/phone within reach;with family/visitor present     Time: 1015-1046 PT Time Calculation (min) (ACUTE ONLY): 31 min  Charges:  $Gait Training: 8-22 mins $Therapeutic Activity: 8-22 mins                    G Codes:      Rica Koyanagi  PTA [facility]  Acute  Rehab Pager      [phone]

## 2016-11-30 NOTE — Progress Notes (Signed)
   Subjective: 2 Days Post-Op Procedure(s) (LRB): LEFT TOTAL KNEE ARTHROPLASTY (Left) Patient reports pain as mild.   Patient seen in rounds with Dr. Wynelle Link. Patient is well, but has had some minor complaints of pain in the knee, requiring pain medications Patient is ready to go home  Objective: Vital signs in last 24 hours: Temp:  [98.1 F (36.7 C)-100.4 F (38 C)] 99.5 F (37.5 C) (02/14 0643) Pulse Rate:  [69-88] 88 (02/14 0643) Resp:  [15-16] 16 (02/14 0643) BP: (99-144)/(59-83) 143/60 (02/14 0643) SpO2:  [96 %-99 %] 98 % (02/14 0643)  Intake/Output from previous day:  Intake/Output Summary (Last 24 hours) at 11/30/16 0733 Last data filed at 11/30/16 0651  Gross per 24 hour  Intake           1151.5 ml  Output             2075 ml  Net           -923.5 ml    Intake/Output this shift: No intake/output data recorded.  Labs:  Recent Labs  11/29/16 0425 11/30/16 0421  HGB 9.7* 9.0*    Recent Labs  11/29/16 0425 11/30/16 0421  WBC 12.5* 13.8*  RBC 3.04* 2.78*  HCT 29.5* 26.8*  PLT 192 182    Recent Labs  11/29/16 0425 11/30/16 0421  NA 138 134*  K 4.1 3.7  CL 106 103  CO2 27 25  BUN 15 11  CREATININE 0.91 0.73  GLUCOSE 117* 123*  CALCIUM 8.4* 8.6*   No results for input(s): LABPT, INR in the last 72 hours.  EXAM: General - Patient is Alert, Appropriate and Oriented Extremity - Neurovascular intact Sensation intact distally Incision - clean, dry, no drainage Motor Function - intact, moving foot and toes well on exam.   Assessment/Plan: 2 Days Post-Op Procedure(s) (LRB): LEFT TOTAL KNEE ARTHROPLASTY (Left) Procedure(s) (LRB): LEFT TOTAL KNEE ARTHROPLASTY (Left) Past Medical History:  Diagnosis Date  . Aortic valve disorders    MILD MVP  . Depression   . External hemorrhoids 1969  . GERD (gastroesophageal reflux disease)   . Hyperlipemia   . Migraine, unspecified, without mention of intractable migraine without mention of status  migrainosus    NONE IN YEARS  . Obstructive sleep apnea   . Osteoarthrosis, unspecified whether generalized or localized, unspecified site   . Other and unspecified hyperlipidemia   . Scab    RIGHT LOWER ARM HEALING FOR LAST 2 WEEKS  . Snoring   . Status post placement of implantable loop recorder    NO LONGER USING   Principal Problem:   OA (osteoarthritis) of knee  Estimated body mass index is 24.63 kg/m as calculated from the following:   Height as of this encounter: 5\' 5"  (1.651 m).   Weight as of this encounter: 67.1 kg (148 lb). Up with therapy Diet - Cardiac diet Follow up - in 2 weeks Activity - WBAT Disposition - Home Condition Upon Discharge - Good D/C Meds - See DC Summary DVT Prophylaxis - [place], PA-C Orthopaedic Surgery 11/30/2016, 7:33 AM

## 2016-12-02 DIAGNOSIS — M25562 Pain in left knee: Secondary | ICD-10-CM | POA: Diagnosis not present

## 2016-12-06 DIAGNOSIS — M25562 Pain in left knee: Secondary | ICD-10-CM | POA: Diagnosis not present

## 2016-12-08 DIAGNOSIS — Z96652 Presence of left artificial knee joint: Secondary | ICD-10-CM | POA: Diagnosis not present

## 2016-12-08 DIAGNOSIS — Z471 Aftercare following joint replacement surgery: Secondary | ICD-10-CM | POA: Diagnosis not present

## 2016-12-08 MED FILL — oxyCODONE HCL 5 MG TABS: 5 | 7 days supply | Qty: 84 | Fill #0

## 2016-12-08 MED FILL — GABAPENTIN 300 MG CAPSULE: 300 | 21 days supply | Qty: 42 | Fill #0

## 2016-12-09 DIAGNOSIS — M25562 Pain in left knee: Secondary | ICD-10-CM | POA: Diagnosis not present

## 2016-12-13 DIAGNOSIS — M25562 Pain in left knee: Secondary | ICD-10-CM | POA: Diagnosis not present

## 2016-12-16 DIAGNOSIS — M25562 Pain in left knee: Secondary | ICD-10-CM | POA: Diagnosis not present

## 2016-12-20 DIAGNOSIS — M25562 Pain in left knee: Secondary | ICD-10-CM | POA: Diagnosis not present

## 2016-12-21 MED FILL — oxyCODONE HCL 5 MG TABS: 5 | 14 days supply | Qty: 84 | Fill #0

## 2016-12-23 DIAGNOSIS — M25562 Pain in left knee: Secondary | ICD-10-CM | POA: Diagnosis not present

## 2016-12-23 MED FILL — DONEPEZIL HCL 5 MG TABLET: 5 | 30 days supply | Qty: 30 | Fill #1

## 2017-01-03 DIAGNOSIS — Z471 Aftercare following joint replacement surgery: Secondary | ICD-10-CM | POA: Diagnosis not present

## 2017-01-03 DIAGNOSIS — Z96652 Presence of left artificial knee joint: Secondary | ICD-10-CM | POA: Diagnosis not present

## 2017-01-16 MED FILL — BUPROPION HCL XL 150 MG TAB: 150 | 90 days supply | Qty: 90 | Fill #0

## 2017-01-16 MED FILL — ROSUVASTATIN CALCIUM 40 MG: 40 | 90 days supply | Qty: 90 | Fill #0

## 2017-01-16 MED FILL — FLUoxetine HCL 40 MG CAPS: 40 | 90 days supply | Qty: 90 | Fill #0

## 2017-01-16 MED FILL — acetaZOLAMIDE 125 MG TABS: 125 | 10 days supply | Qty: 20 | Fill #0

## 2017-01-20 MED FILL — DONEPEZIL HCL 5 MG TABLET: 5 | 30 days supply | Qty: 30 | Fill #2

## 2017-01-20 MED FILL — CELECOXIB 200 MG CAP: 200 | 90 days supply | Qty: 90 | Fill #1

## 2017-01-25 DIAGNOSIS — H31001 Unspecified chorioretinal scars, right eye: Secondary | ICD-10-CM | POA: Diagnosis not present

## 2017-01-25 DIAGNOSIS — H2513 Age-related nuclear cataract, bilateral: Secondary | ICD-10-CM | POA: Diagnosis not present

## 2017-01-25 DIAGNOSIS — H524 Presbyopia: Secondary | ICD-10-CM | POA: Diagnosis not present

## 2017-01-25 DIAGNOSIS — H43813 Vitreous degeneration, bilateral: Secondary | ICD-10-CM | POA: Diagnosis not present

## 2017-02-07 DIAGNOSIS — Z471 Aftercare following joint replacement surgery: Secondary | ICD-10-CM | POA: Diagnosis not present

## 2017-02-07 DIAGNOSIS — Z96652 Presence of left artificial knee joint: Secondary | ICD-10-CM | POA: Diagnosis not present

## 2017-02-15 DIAGNOSIS — G3184 Mild cognitive impairment, so stated: Secondary | ICD-10-CM | POA: Diagnosis not present

## 2017-02-15 DIAGNOSIS — R413 Other amnesia: Secondary | ICD-10-CM | POA: Diagnosis not present

## 2017-02-15 DIAGNOSIS — R41844 Frontal lobe and executive function deficit: Secondary | ICD-10-CM | POA: Diagnosis not present

## 2017-02-15 DIAGNOSIS — F329 Major depressive disorder, single episode, unspecified: Secondary | ICD-10-CM | POA: Diagnosis not present

## 2017-02-16 DIAGNOSIS — M25512 Pain in left shoulder: Secondary | ICD-10-CM | POA: Diagnosis not present

## 2017-03-20 NOTE — Addendum Note (Signed)
Addendum  created 03/20/17 1107 by Myrtie Soman, MD   Sign clinical note

## 2017-03-20 NOTE — Anesthesia Postprocedure Evaluation (Signed)
Anesthesia Post Note  Patient: Angelica Guerrero  Procedure(s) Performed: Procedure(s) (LRB): LEFT TOTAL KNEE ARTHROPLASTY (Left)     Anesthesia Post Evaluation  Last Vitals:  Vitals:   11/29/16 2215 11/30/16 0643  BP: (!) 144/83 (!) 143/60  Pulse: 79 88  Resp: 16 16  Temp: 37.8 C 37.5 C    Last Pain:  Vitals:   11/30/16 1000  TempSrc:   PainSc: 3                  Mindi Akerson S

## 2017-03-28 DIAGNOSIS — M25512 Pain in left shoulder: Secondary | ICD-10-CM | POA: Diagnosis not present

## 2017-04-26 DIAGNOSIS — M25512 Pain in left shoulder: Secondary | ICD-10-CM | POA: Diagnosis not present

## 2017-05-11 ENCOUNTER — Other Ambulatory Visit: Payer: Self-pay | Admitting: Orthopedic Surgery

## 2017-05-11 DIAGNOSIS — M25512 Pain in left shoulder: Secondary | ICD-10-CM

## 2017-05-11 MED FILL — BUPROPION HCL XL 150 MG TAB: 150 | 90 days supply | Qty: 90 | Fill #1 | Status: TO

## 2017-05-11 MED FILL — ROSUVASTATIN CALCIUM 40 MG: 40 | 90 days supply | Qty: 90 | Fill #1 | Status: TO

## 2017-05-11 MED FILL — FLUoxetine HCL 40 MG CAPS: 40 | 90 days supply | Qty: 90 | Fill #1 | Status: TO

## 2017-05-15 ENCOUNTER — Ambulatory Visit
Admission: RE | Admit: 2017-05-15 | Discharge: 2017-05-15 | Disposition: A | Discharge status: Home / Self Care | Payer: Medicare Other | Source: Ambulatory Visit | Attending: Orthopedic Surgery | Admitting: Orthopedic Surgery

## 2017-05-15 DIAGNOSIS — S46012A Strain of muscle(s) and tendon(s) of the rotator cuff of left shoulder, initial encounter: Secondary | ICD-10-CM | POA: Diagnosis not present

## 2017-05-15 DIAGNOSIS — M25512 Pain in left shoulder: Secondary | ICD-10-CM

## 2017-05-15 MED ORDER — IOPAMIDOL (ISOVUE-M 200) INJECTION 41%
20.0000 mL | Freq: Once | INTRAMUSCULAR | Status: AC
  Administered 2017-05-15: 20 mL via INTRA_ARTICULAR

## 2017-05-17 DIAGNOSIS — H5712 Ocular pain, left eye: Secondary | ICD-10-CM | POA: Diagnosis not present

## 2017-05-17 DIAGNOSIS — H1032 Unspecified acute conjunctivitis, left eye: Secondary | ICD-10-CM | POA: Diagnosis not present

## 2017-05-17 DIAGNOSIS — M7542 Impingement syndrome of left shoulder: Secondary | ICD-10-CM | POA: Diagnosis not present

## 2017-06-06 ENCOUNTER — Telehealth: Payer: Self-pay

## 2017-06-06 ENCOUNTER — Other Ambulatory Visit: Payer: Self-pay

## 2017-06-06 MED ORDER — ALOSETRON HCL 0.5 MG PO TABS
0.5000 mg | ORAL_TABLET | Freq: Two times a day (BID) | ORAL | 3 refills | Status: DC
Start: 2017-06-06 — End: 2017-06-16

## 2017-06-06 NOTE — Telephone Encounter (Signed)
Lotronex refill sent as requested I left a message for the patient's husband to call back and schedule colonoscopy

## 2017-06-16 ENCOUNTER — Other Ambulatory Visit: Payer: Self-pay

## 2017-06-16 ENCOUNTER — Telehealth: Payer: Self-pay

## 2017-06-16 MED ORDER — ALOSETRON HCL 0.5 MG PO TABS: 0.5000 mg | ORAL_TABLET | Freq: Two times a day (BID) | ORAL | 3 refills | Status: DC

## 2017-06-16 NOTE — Telephone Encounter (Signed)
Received fax from [facility] outpatient pharmacy stating patient needs PA on Lotronex. Called [facility] and patient's Lotronex was approved from 03/18/17-06/16/18 Case #[id number].  Faxed approval to pharmacy.

## 2017-06-21 DIAGNOSIS — I10 Essential (primary) hypertension: Secondary | ICD-10-CM | POA: Diagnosis not present

## 2017-06-21 DIAGNOSIS — F320 Major depressive disorder, single episode, mild: Secondary | ICD-10-CM | POA: Diagnosis not present

## 2017-06-21 DIAGNOSIS — G3184 Mild cognitive impairment, so stated: Secondary | ICD-10-CM | POA: Diagnosis not present

## 2017-07-03 ENCOUNTER — Encounter (HOSPITAL_COMMUNITY): Payer: Self-pay | Admitting: Family Medicine

## 2017-07-03 ENCOUNTER — Ambulatory Visit (HOSPITAL_COMMUNITY)
Admission: EM | Admit: 2017-07-03 | Discharge: 2017-07-03 | Disposition: A | Discharge status: Home / Self Care | Payer: Medicare Other | Attending: Family Medicine | Admitting: Family Medicine

## 2017-07-03 DIAGNOSIS — S81811A Laceration without foreign body, right lower leg, initial encounter: Secondary | ICD-10-CM | POA: Diagnosis not present

## 2017-07-03 DIAGNOSIS — S80211A Abrasion, right knee, initial encounter: Secondary | ICD-10-CM

## 2017-07-03 DIAGNOSIS — W268XXA Contact with other sharp object(s), not elsewhere classified, initial encounter: Secondary | ICD-10-CM | POA: Diagnosis not present

## 2017-07-03 DIAGNOSIS — Z23 Encounter for immunization: Secondary | ICD-10-CM | POA: Diagnosis not present

## 2017-07-03 DIAGNOSIS — S50811A Abrasion of right forearm, initial encounter: Secondary | ICD-10-CM

## 2017-07-03 MED ORDER — TETANUS-DIPHTH-ACELL PERTUSSIS 5-2.5-18.5 LF-MCG/0.5 IM SUSP
0.5000 mL | Freq: Once | INTRAMUSCULAR | Status: AC
Start: 2017-07-03 — End: 2017-07-03
  Administered 2017-07-03: 0.5 mL via INTRAMUSCULAR

## 2017-07-03 MED ORDER — TETANUS-DIPHTH-ACELL PERTUSSIS 5-2.5-18.5 LF-MCG/0.5 IM SUSP
INTRAMUSCULAR | Status: AC
  Filled 2017-07-03: qty 0.5

## 2017-07-03 MED ORDER — MUPIROCIN 2 % EX OINT: 1.0000 "application " | TOPICAL_OINTMENT | Freq: Three times a day (TID) | CUTANEOUS | 1 refills | Status: DC

## 2017-07-03 MED ORDER — LIDOCAINE-EPINEPHRINE (PF) 2 %-1:200000 IJ SOLN
INTRAMUSCULAR | Status: AC
  Filled 2017-07-03: qty 20

## 2017-07-03 MED FILL — MUPIROCIN 2% OINTMENT: 2 | 10 days supply | Qty: 22 | Fill #0

## 2017-07-03 NOTE — ED Notes (Signed)
Bacitracin applied to suture site then sterile 4x4s then kerlex then coban. Patient and husband explained dressing changes. Extra supplies given for home.

## 2017-07-03 NOTE — Discharge Instructions (Signed)
Leave dressing on for 24 hours. Then remove and use the antibiotic cream every day as you redress the wound. Try to keep dry for 24 hours and then gently washed with soap and water  Stitches out in 10 days or so  Return if any increase in pain or signs of infection

## 2017-07-03 NOTE — ED Provider Notes (Signed)
[place]    CSN: [id number] Arrival date & time: 07/03/17  1401     History   Chief Complaint Chief Complaint  Patient presents with  . Leg Injury    HPI NANETTE WIRSING is a 71 y.o. female.   Patient fell and lacerated her right leg.      Past Medical History:  Diagnosis Date  . Aortic valve disorders    MILD MVP  . Depression   . External hemorrhoids 1969  . GERD (gastroesophageal reflux disease)   . Hyperlipemia   . Migraine, unspecified, without mention of intractable migraine without mention of status migrainosus    NONE IN YEARS  . Obstructive sleep apnea   . Osteoarthrosis, unspecified whether generalized or localized, unspecified site   . Other and unspecified hyperlipidemia   . Scab    RIGHT LOWER ARM HEALING FOR LAST 2 WEEKS  . Snoring   . Status post placement of implantable loop recorder    NO LONGER USING    Patient Active Problem List   Diagnosis Date Noted  . Acute medial meniscal tear 08/07/2013  . Amaurosis fugax of left eye 12/24/2012  . Diarrhea 01/17/2011  . OBSTRUCTIVE SLEEP APNEA 11/15/2010  . MIGRAINE HEADACHE 06/14/2010  . AORTIC INSUFFICIENCY, MILD 06/14/2010  . GERD 06/14/2010  . DIVERTICULOSIS OF COLON 06/14/2010  . OA (osteoarthritis) of knee 06/14/2010  . DEGENERATIVE DISC DISEASE, CERVICAL SPINE 06/14/2010  . COUGH 06/14/2010  . DYSPHAGIA OROPHARYNGEAL PHASE 06/14/2010  . SNORING 03/18/2009  . HYPERLIPIDEMIA 03/17/2009    Past Surgical History:  Procedure Laterality Date  . BLADDER SURGERY     Tacking with hysterectomy  . FACIAL COSMETIC SURGERY    . KNEE ARTHROSCOPY Left 08/07/2013   Procedure: LEFT ARTHROSCOPY KNEE WITH DEBRIDEMENT;  Surgeon: [name], MD;  Location: [facility];  Service: Orthopedics;  Laterality: Left;  . LOOP RECORDER IMPLANT  01-11-2013   Medtronic LinQ implanted by Dr Lovena Le for cryptogenic stroke  . LOOP RECORDER IMPLANT Left 01/11/2013   Procedure: LOOP RECORDER IMPLANT;   Surgeon: Evans Lance, MD;  Location: [facility] CATH LAB;  Service: Cardiovascular;  Laterality: Left;  . TONSILLECTOMY    . TOTAL ABDOMINAL HYSTERECTOMY     PARTIAL  . TOTAL KNEE ARTHROPLASTY  2009   right knee   . TOTAL KNEE ARTHROPLASTY Left 11/28/2016   Procedure: LEFT TOTAL KNEE ARTHROPLASTY;  Surgeon: Gaynelle Arabian, MD;  Location: [facility];  Service: Orthopedics;  Laterality: Left;  requests 38mins  . TUBAL LIGATION     bilateral    OB History    No data available       Home Medications    Prior to Admission medications   Medication Sig Start Date End Date Taking? Authorizing Provider  diphenhydramine-acetaminophen (TYLENOL PM) 25-500 MG TABS tablet Take 2 tablets by mouth at bedtime as needed (sleep).    [provider]  esomeprazole (NEXIUM) 20 MG capsule Take 20 mg by mouth.     [provider]  FLUoxetine (PROZAC) 40 MG capsule Take 40 mg by mouth daily.     [provider]  methocarbamol (ROBAXIN) 500 MG tablet Take 1 tablet (500 mg total) by mouth every 6 (six) hours as needed for muscle spasms. 11/29/16   Perkins, Alexzandrew L, PA-C  mupirocin ointment (BACTROBAN) 2 % Apply 1 application topically 3 (three) times daily. 07/03/17   Robyn Haber, MD  oxyCODONE (OXY IR/ROXICODONE) 5 MG immediate release tablet Take 1-2 tablets (5-10  mg total) by mouth every 4 (four) hours as needed for moderate pain or severe pain. 11/29/16   Perkins, Alexzandrew L, PA-C  rosuvastatin (CRESTOR) 40 MG tablet Take 40 mg by mouth daily.    [provider]    Family History Family History  Problem Relation Age of Onset  . Alzheimer's disease Mother   . Hyperlipidemia Mother   . Diabetes Father        type II  . Allergies Father   . Asthma Father   . Factor IX deficiency Brother   . Factor IX deficiency Brother   . Allergies Child   . Asthma Grandchild   . Rheum arthritis Maternal Grandmother   . Colon polyps Paternal Uncle   . Dementia Paternal  Grandmother   . Alzheimer's disease Maternal Aunt   . Colon cancer Neg Hx     Social History Social History  Substance Use Topics  . Smoking status: Former Smoker    Packs/day: 1.00    Years: 10.00    Types: Cigarettes    Quit date: 10/17/2000  . Smokeless tobacco: Never Used  . Alcohol use Yes     Comment: glass of wine ONE A NIGHT     Allergies   Lactose intolerance (gi) and Morphine and related   Review of Systems Review of Systems  Skin: Positive for wound.  All other systems reviewed and are negative.    Physical Exam Triage Vital Signs ED Triage Vitals  Enc Vitals Group     BP      Pulse      Resp      Temp      Temp src      SpO2      Weight      Height      Head Circumference      Peak Flow      Pain Score      Pain Loc      Pain Edu?      Excl. in [place]?    No data found.   Updated Vital Signs BP (!) 141/83 (BP Location: Right Arm)   Pulse 76   Temp 98.8 F (37.1 C) (Oral)   Resp 20   SpO2 96%    Physical Exam  Constitutional: She appears well-developed and well-nourished.  Skin:  20 cm V shaped laceration, mid right shin exposing fascia  Nursing note and vitals reviewed.    UC Treatments / Results  Labs (all labs ordered are listed, but only abnormal results are displayed) Labs Reviewed - No data to display  EKG  EKG Interpretation None       Radiology No results found.  Procedures .Marland KitchenLaceration Repair Date/Time: 07/03/2017 3:47 PM Performed by: Robyn Haber Authorized by: Robyn Haber   Consent:    Consent obtained:  Verbal   Consent given by:  Patient and spouse   Risks discussed:  Infection and need for additional repair   Alternatives discussed:  No treatment Anesthesia (see MAR for exact dosages):    Anesthesia method:  Local infiltration   Local anesthetic:  Lidocaine 2% WITH epi Laceration details:    Location:  Leg   Leg location:  R lower leg   Length (cm):  20   Depth (mm):  6 Repair type:     Repair type:  Complex Pre-procedure details:    Preparation:  Patient was prepped and draped in usual sterile fashion Exploration:    Hemostasis achieved with:  Epinephrine  Wound exploration: wound explored through full range of motion     Wound extent: vascular damage     Wound extent: no foreign bodies/material noted and no nerve damage noted     Contaminated: no   Treatment:    Area cleansed with:  Betadine   Amount of cleaning:  Extensive   Irrigation solution:  Sterile saline   Irrigation method:  Pressure wash   Visualized foreign bodies/material removed: no     Debridement:  Minimal   Undermining:  None   Scar revision: no   Skin repair:    Repair method:  Sutures   Suture size:  4-0   Suture technique:  Horizontal mattress   Number of sutures:  20 Approximation:    Approximation:  Close   Vermilion border: well-aligned   Post-procedure details:    Dressing:  Bulky dressing   Patient tolerance of procedure:  Tolerated well, no immediate complications   (including critical care time)  Medications Ordered in UC Medications  Tdap (BOOSTRIX) injection 0.5 mL (not administered)     Initial Impression / Assessment and Plan / UC Course  I have reviewed the triage vital signs and the nursing notes.  Pertinent labs & imaging results that were available during my care of the patient were reviewed by me and considered in my medical decision making (see chart for details).     Final Clinical Impressions(s) / UC Diagnoses   Final diagnoses:  Laceration of right lower extremity excluding thigh, initial encounter    New Prescriptions New Prescriptions   MUPIROCIN OINTMENT (BACTROBAN) 2 %    Apply 1 application topically 3 (three) times daily.     Controlled Substance Prescriptions [place] Controlled Substance Registry consulted? Not Applicable   Robyn Haber, MD 07/03/17 [phone]

## 2017-07-03 NOTE — ED Triage Notes (Signed)
Patient was trying to step into a raised garden and hit right leg on brick edging.  Toe next to right great toe is scraped at tip. Right knee abrasion and right forearm abrasion.  V-shaped laceration to right anterior lower leg

## 2017-07-11 ENCOUNTER — Encounter (HOSPITAL_COMMUNITY): Payer: Self-pay | Admitting: Emergency Medicine

## 2017-07-11 ENCOUNTER — Ambulatory Visit (HOSPITAL_COMMUNITY)
Admission: EM | Admit: 2017-07-11 | Discharge: 2017-07-11 | Disposition: A | Discharge status: Home / Self Care | Payer: Medicare Other | Attending: Urgent Care | Admitting: Urgent Care

## 2017-07-11 DIAGNOSIS — Z5189 Encounter for other specified aftercare: Secondary | ICD-10-CM

## 2017-07-11 DIAGNOSIS — T148XXA Other injury of unspecified body region, initial encounter: Secondary | ICD-10-CM

## 2017-07-11 DIAGNOSIS — M79661 Pain in right lower leg: Secondary | ICD-10-CM

## 2017-07-11 DIAGNOSIS — L089 Local infection of the skin and subcutaneous tissue, unspecified: Secondary | ICD-10-CM

## 2017-07-11 MED ORDER — CEPHALEXIN 500 MG PO CAPS: 500.0000 mg | ORAL_CAPSULE | Freq: Three times a day (TID) | ORAL | 0 refills | Status: DC

## 2017-07-11 MED ORDER — ACETAMINOPHEN-CODEINE #3 300-30 MG PO TABS: 1.0000 | ORAL_TABLET | Freq: Four times a day (QID) | ORAL | 0 refills | Status: DC | PRN

## 2017-07-11 MED FILL — ACETAMINOPHEN/COD #3 TABLET: 300-30 | 3 days supply | Qty: 20 | Fill #0

## 2017-07-11 MED FILL — CEPHALEXIN 500 MG CAPSULE: 500 | 7 days supply | Qty: 21 | Fill #0

## 2017-07-11 NOTE — Discharge Instructions (Signed)
Apply a dressing to your wound at bedtime. Otherwise, leave it open to the air, clean and dry. If you are planning on being outdoors, please apply a dressing to your wound to prevent exposure to bacteria. Gently clean the wound with warm soap and water daily. Do not use any other antibiotic apart from Keflex for your wound infection.

## 2017-07-11 NOTE — ED Triage Notes (Signed)
Pt has a wound on her right shin that was sutured here 8 days ago.  Pt complains of pain at the site.  The wound is oozing and has red edges.  Sutures are due to come out today.   Pt's husband is a physician and got her a prescriptions for Cefdinir that she started last night.

## 2017-07-11 NOTE — ED Provider Notes (Signed)
    MRN: [id number] DOB: 08/26/1946  Subjective:   Angelica Guerrero is a 71 y.o. female presenting for Wound Check  Patient was last seen on 07/03/2017 for laceration of her right lower leg. Her laceration was repaired and was advised to apply Neosporin at home. Today, reports tenderness over wound, drainage, warmth. She started cefdinir last night from a prescription her husband had.   No current facility-administered medications for this encounter.   Current Outpatient Prescriptions:  .  esomeprazole (NEXIUM) 20 MG capsule, Take 20 mg by mouth. , Disp: , Rfl:  .  FLUoxetine (PROZAC) 40 MG capsule, Take 40 mg by mouth daily. , Disp: , Rfl:  .  lisinopril (PRINIVIL,ZESTRIL) 5 MG tablet, Take 5 mg by mouth daily., Disp: , Rfl:  .  rosuvastatin (CRESTOR) 40 MG tablet, Take 40 mg by mouth daily., Disp: , Rfl:  .  diphenhydramine-acetaminophen (TYLENOL PM) 25-500 MG TABS tablet, Take 2 tablets by mouth at bedtime as needed (sleep)., Disp: , Rfl:  .  methocarbamol (ROBAXIN) 500 MG tablet, Take 1 tablet (500 mg total) by mouth every 6 (six) hours as needed for muscle spasms., Disp: 80 tablet, Rfl: 0 .  mupirocin ointment (BACTROBAN) 2 %, Apply 1 application topically 3 (three) times daily., Disp: 30 g, Rfl: 1 .  oxyCODONE (OXY IR/ROXICODONE) 5 MG immediate release tablet, Take 1-2 tablets (5-10 mg total) by mouth every 4 (four) hours as needed for moderate pain or severe pain., Disp: 80 tablet, Rfl: 0   [name] is allergic to lactose intolerance (gi) and morphine and related.  [name]  has a past medical history of Aortic valve disorders; Depression; External hemorrhoids (1969); GERD (gastroesophageal reflux disease); Hyperlipemia; Migraine, unspecified, without mention of intractable migraine without mention of status migrainosus; Obstructive sleep apnea; Osteoarthrosis, unspecified whether generalized or localized, unspecified site; Other and unspecified hyperlipidemia; Scab; Snoring; and Status  post placement of implantable loop recorder. Also  has a past surgical history that includes Tubal ligation; Total knee arthroplasty (2009); Facial cosmetic surgery; Bladder surgery; Tonsillectomy; Loop recorder implant (01-11-2013); Knee arthroscopy (Left, 08/07/2013); loop recorder implant (Left, 01/11/2013); Total abdominal hysterectomy; and Total knee arthroplasty (Left, 11/28/2016).  Objective:   Vitals: BP 140/78 (BP Location: Right Arm)   Pulse 70   Temp 98.5 F (36.9 C) (Oral)   SpO2 100%   Physical Exam  Constitutional: She is oriented to person, place, and time. She appears well-developed and well-nourished.  Cardiovascular: Normal rate.   Pulmonary/Chest: Effort normal.  Neurological: She is alert and oriented to person, place, and time.  Skin: Skin is warm and dry.     Psychiatric: She has a normal mood and affect.    Assessment and Plan :   Visit for wound check  Wound infection  Pain of right lower leg  Will start patient on Keflex for her wound infection, Tylenol #3 for pain, continue using celecoxib. Wound is loosely approximated in several areas and is not amenable to suture removal. For now, sutures will be left in place. Return-to-clinic precautions discussed, patient verbalized understanding. Recheck in 3 days or sooner as directed.  Jaynee Eagles, PA-C [facility]  07/11/2017  10:19 AM    Jaynee Eagles, PA-C 07/11/17 1039

## 2017-07-14 ENCOUNTER — Encounter (HOSPITAL_COMMUNITY): Payer: Self-pay | Admitting: *Deleted

## 2017-07-14 ENCOUNTER — Ambulatory Visit (HOSPITAL_COMMUNITY)
Admission: EM | Admit: 2017-07-14 | Discharge: 2017-07-14 | Disposition: A | Discharge status: Home / Self Care | Payer: Medicare Other | Attending: Internal Medicine | Admitting: Internal Medicine

## 2017-07-14 DIAGNOSIS — Z4802 Encounter for removal of sutures: Secondary | ICD-10-CM | POA: Diagnosis not present

## 2017-07-14 NOTE — ED Provider Notes (Signed)
[facility]    CSN: [id number] Arrival date & time: 07/14/17  1144     History   Chief Complaint Chief Complaint  Patient presents with  . Wound Check    HPI Angelica Guerrero is a 71 y.o. female.   Pt presents for suture removal. Denies fevers, N/V/D. Admits to spot tenderness around suture line      Past Medical History:  Diagnosis Date  . Aortic valve disorders    MILD MVP  . Depression   . External hemorrhoids 1969  . GERD (gastroesophageal reflux disease)   . Hyperlipemia   . Migraine, unspecified, without mention of intractable migraine without mention of status migrainosus    NONE IN YEARS  . Obstructive sleep apnea   . Osteoarthrosis, unspecified whether generalized or localized, unspecified site   . Other and unspecified hyperlipidemia   . Scab    RIGHT LOWER ARM HEALING FOR LAST 2 WEEKS  . Snoring   . Status post placement of implantable loop recorder    NO LONGER USING    Patient Active Problem List   Diagnosis Date Noted  . Acute medial meniscal tear 08/07/2013  . Amaurosis fugax of left eye 12/24/2012  . Diarrhea 01/17/2011  . OBSTRUCTIVE SLEEP APNEA 11/15/2010  . MIGRAINE HEADACHE 06/14/2010  . AORTIC INSUFFICIENCY, MILD 06/14/2010  . GERD 06/14/2010  . DIVERTICULOSIS OF COLON 06/14/2010  . OA (osteoarthritis) of knee 06/14/2010  . DEGENERATIVE DISC DISEASE, CERVICAL SPINE 06/14/2010  . COUGH 06/14/2010  . DYSPHAGIA OROPHARYNGEAL PHASE 06/14/2010  . SNORING 03/18/2009  . HYPERLIPIDEMIA 03/17/2009    Past Surgical History:  Procedure Laterality Date  . BLADDER SURGERY     Tacking with hysterectomy  . FACIAL COSMETIC SURGERY    . KNEE ARTHROSCOPY Left 08/07/2013   Procedure: LEFT ARTHROSCOPY KNEE WITH DEBRIDEMENT;  Surgeon: [name], MD;  Location: [facility];  Service: Orthopedics;  Laterality: Left;  . LOOP RECORDER IMPLANT  01-11-2013   Medtronic LinQ implanted by Dr Lovena Le for cryptogenic stroke  . LOOP RECORDER  IMPLANT Left 01/11/2013   Procedure: LOOP RECORDER IMPLANT;  Surgeon: Evans Lance, MD;  Location: [facility] CATH LAB;  Service: Cardiovascular;  Laterality: Left;  . TONSILLECTOMY    . TOTAL ABDOMINAL HYSTERECTOMY     PARTIAL  . TOTAL KNEE ARTHROPLASTY  2009   right knee   . TOTAL KNEE ARTHROPLASTY Left 11/28/2016   Procedure: LEFT TOTAL KNEE ARTHROPLASTY;  Surgeon: Gaynelle Arabian, MD;  Location: [facility];  Service: Orthopedics;  Laterality: Left;  requests 28mins  . TUBAL LIGATION     bilateral    OB History    No data available       Home Medications    Prior to Admission medications   Medication Sig Start Date End Date Taking? Authorizing Provider  acetaminophen-codeine (TYLENOL #3) 300-30 MG tablet Take 1-2 tablets by mouth every 6 (six) hours as needed for severe pain. 07/11/17   Jaynee Eagles, PA-C  cephALEXin (KEFLEX) 500 MG capsule Take 1 capsule (500 mg total) by mouth 3 (three) times daily. 07/11/17   Jaynee Eagles, PA-C  diphenhydramine-acetaminophen (TYLENOL PM) 25-500 MG TABS tablet Take 2 tablets by mouth at bedtime as needed (sleep).    [provider]  esomeprazole (NEXIUM) 20 MG capsule Take 20 mg by mouth.     [provider]  FLUoxetine (PROZAC) 40 MG capsule Take 40 mg by mouth daily.     [provider]  lisinopril (PRINIVIL,ZESTRIL) 5  MG tablet Take 5 mg by mouth daily.    [provider]  methocarbamol (ROBAXIN) 500 MG tablet Take 1 tablet (500 mg total) by mouth every 6 (six) hours as needed for muscle spasms. 11/29/16   Perkins, Alexzandrew L, PA-C  mupirocin ointment (BACTROBAN) 2 % Apply 1 application topically 3 (three) times daily. 07/03/17   Robyn Haber, MD  oxyCODONE (OXY IR/ROXICODONE) 5 MG immediate release tablet Take 1-2 tablets (5-10 mg total) by mouth every 4 (four) hours as needed for moderate pain or severe pain. 11/29/16   Perkins, Alexzandrew L, PA-C  rosuvastatin (CRESTOR) 40 MG tablet Take 40 mg by mouth daily.     [provider]    Family History Family History  Problem Relation Age of Onset  . Alzheimer's disease Mother   . Hyperlipidemia Mother   . Diabetes Father        type II  . Allergies Father   . Asthma Father   . Factor IX deficiency Brother   . Factor IX deficiency Brother   . Allergies Child   . Asthma Grandchild   . Rheum arthritis Maternal Grandmother   . Colon polyps Paternal Uncle   . Dementia Paternal Grandmother   . Alzheimer's disease Maternal Aunt   . Colon cancer Neg Hx     Social History Social History  Substance Use Topics  . Smoking status: Former Smoker    Packs/day: 1.00    Years: 10.00    Types: Cigarettes    Quit date: 10/17/2000  . Smokeless tobacco: Never Used  . Alcohol use Yes     Comment: glass of wine ONE A NIGHT     Allergies   Lactose intolerance (gi) and Morphine and related   Review of Systems Review of Systems  Constitutional: Negative for chills and fever.  HENT: Negative for sore throat and tinnitus.   Eyes: Negative for redness.  Respiratory: Negative for cough and shortness of breath.   Cardiovascular: Negative for chest pain and palpitations.  Gastrointestinal: Negative for abdominal pain, diarrhea, nausea and vomiting.  Genitourinary: Negative for dysuria, frequency and urgency.  Musculoskeletal: Negative for myalgias.  Skin: Negative for rash.       No lesions  Neurological: Negative for weakness.  Hematological: Does not bruise/bleed easily.  Psychiatric/Behavioral: Negative for suicidal ideas.     Physical Exam Triage Vital Signs ED Triage Vitals  Enc Vitals Group     BP 07/14/17 1242 (!) 147/89     Pulse Rate 07/14/17 1242 88     Resp 07/14/17 1242 18     Temp 07/14/17 1242 98.5 F (36.9 C)     Temp Source 07/14/17 1242 Oral     SpO2 07/14/17 1242 100 %     Weight --      Height --      Head Circumference --      Peak Flow --      Pain Score 07/14/17 1240 1     Pain Loc --      Pain Edu? --       Excl. in [place]? --    No data found.   Updated Vital Signs BP (!) 147/89 (BP Location: Right Arm)   Pulse 88   Temp 98.5 F (36.9 C) (Oral)   Resp 18   SpO2 100%   Visual Acuity Right Eye Distance:   Left Eye Distance:   Bilateral Distance:    Right Eye Near:   Left Eye Near:  Bilateral Near:     Physical Exam  Constitutional: She is oriented to person, place, and time. She appears well-developed and well-nourished. No distress.  HENT:  Head: Normocephalic and atraumatic.  Mouth/Throat: Oropharynx is clear and moist.  Eyes: Pupils are equal, round, and reactive to light. Conjunctivae and EOM are normal. No scleral icterus.  Neck: Normal range of motion. Neck supple. No JVD present. No tracheal deviation present. No thyromegaly present.  Cardiovascular: Normal rate, regular rhythm and normal heart sounds.  Exam reveals no gallop and no friction rub.   No murmur heard. Pulmonary/Chest: Effort normal and breath sounds normal.  Abdominal: Soft. Bowel sounds are normal. She exhibits no distension. There is no tenderness.  Musculoskeletal: Normal range of motion. She exhibits no edema.  Lymphadenopathy:    She has no cervical adenopathy.  Neurological: She is alert and oriented to person, place, and time. No cranial nerve deficit.  Skin: Skin is warm and dry.  V-shaped wound with partially imbedded sutures 3 cm on each side down to apex of "V"; some tiny areas of hematoma. No pus  Psychiatric: She has a normal mood and affect. Her behavior is normal. Judgment and thought content normal.  Nursing note and vitals reviewed.    UC Treatments / Results  Labs (all labs ordered are listed, but only abnormal results are displayed) Labs Reviewed - No data to display  EKG  EKG Interpretation None       Radiology No results found.  Procedures Procedures (including critical care time)  Medications Ordered in UC Medications - No data to display   Initial Impression /  Assessment and Plan / UC Course  I have reviewed the triage vital signs and the nursing notes.  Pertinent labs & imaging results that were available during my care of the patient were reviewed by me and considered in my medical decision making (see chart for details).     Advised warm compresses to help draw out areas of hematoma. RTC for s/s of sepsis or severe wound dehisence  Final Clinical Impressions(s) / UC Diagnoses   Final diagnoses:  Visit for suture removal    New Prescriptions Discharge Medication List as of 07/14/2017  2:02 PM       Controlled Substance Prescriptions [place] Controlled Substance Registry consulted? Not Applicable   Harrie Foreman, MD 07/14/17 1410

## 2017-07-14 NOTE — ED Triage Notes (Signed)
Here  For  Possible   Suture  Removal   Sutures  Have  Been in  For  10  Days     Some  Redness    Present   Denies   Any pain

## 2017-08-28 DIAGNOSIS — E7849 Other hyperlipidemia: Secondary | ICD-10-CM | POA: Diagnosis not present

## 2017-08-28 DIAGNOSIS — R7301 Impaired fasting glucose: Secondary | ICD-10-CM | POA: Diagnosis not present

## 2017-09-04 DIAGNOSIS — R32 Unspecified urinary incontinence: Secondary | ICD-10-CM | POA: Diagnosis not present

## 2017-09-04 DIAGNOSIS — E7849 Other hyperlipidemia: Secondary | ICD-10-CM | POA: Diagnosis not present

## 2017-09-04 DIAGNOSIS — Z1389 Encounter for screening for other disorder: Secondary | ICD-10-CM | POA: Diagnosis not present

## 2017-09-04 DIAGNOSIS — G43909 Migraine, unspecified, not intractable, without status migrainosus: Secondary | ICD-10-CM | POA: Diagnosis not present

## 2017-09-04 DIAGNOSIS — Z0000 Encounter for general adult medical examination without abnormal findings: Secondary | ICD-10-CM | POA: Diagnosis not present

## 2017-09-04 DIAGNOSIS — F418 Other specified anxiety disorders: Secondary | ICD-10-CM | POA: Diagnosis not present

## 2017-09-04 DIAGNOSIS — Z23 Encounter for immunization: Secondary | ICD-10-CM | POA: Diagnosis not present

## 2017-09-04 DIAGNOSIS — G4709 Other insomnia: Secondary | ICD-10-CM | POA: Diagnosis not present

## 2017-09-04 DIAGNOSIS — G4733 Obstructive sleep apnea (adult) (pediatric): Secondary | ICD-10-CM | POA: Diagnosis not present

## 2017-09-04 DIAGNOSIS — Z6826 Body mass index (BMI) 26.0-26.9, adult: Secondary | ICD-10-CM | POA: Diagnosis not present

## 2017-09-04 DIAGNOSIS — R7301 Impaired fasting glucose: Secondary | ICD-10-CM | POA: Diagnosis not present

## 2017-09-04 DIAGNOSIS — G3184 Mild cognitive impairment, so stated: Secondary | ICD-10-CM | POA: Diagnosis not present

## 2017-09-06 ENCOUNTER — Telehealth: Payer: Self-pay | Admitting: Gastroenterology

## 2017-09-06 NOTE — Telephone Encounter (Signed)
Patient with continued bothersome diarrhea. She wants to wait and see Dr. Fuller Plan.  She will come in on 09/18/17

## 2017-09-08 MED FILL — acetaZOLAMIDE 125 MG TABS: 125 | 10 days supply | Qty: 20 | Fill #0

## 2017-09-12 ENCOUNTER — Ambulatory Visit (AMBULATORY_SURGERY_CENTER): Payer: Self-pay | Admitting: *Deleted

## 2017-09-12 ENCOUNTER — Other Ambulatory Visit: Payer: Self-pay

## 2017-09-12 VITALS — Ht 66.0 in | Wt 154.0 lb

## 2017-09-12 DIAGNOSIS — R197 Diarrhea, unspecified: Secondary | ICD-10-CM

## 2017-09-12 MED ORDER — NA SULFATE-K SULFATE-MG SULF 17.5-3.13-1.6 GM/177ML PO SOLN: ORAL | 0 refills | Status: DC

## 2017-09-12 NOTE — Progress Notes (Signed)
Patient denies any allergies to eggs or soy. Patient denies any problems with anesthesia/sedation. Patient denies any oxygen use at home. Patient denies taking any diet/weight loss medications or blood thinners.  

## 2017-09-18 ENCOUNTER — Ambulatory Visit: Payer: Medicare Other | Admitting: Gastroenterology

## 2017-09-20 ENCOUNTER — Ambulatory Visit (AMBULATORY_SURGERY_CENTER): Payer: Medicare Other | Admitting: Gastroenterology

## 2017-09-20 ENCOUNTER — Other Ambulatory Visit: Payer: Self-pay

## 2017-09-20 ENCOUNTER — Encounter: Payer: Self-pay | Admitting: Gastroenterology

## 2017-09-20 VITALS — BP 128/78 | HR 81 | Temp 98.4°F | Resp 15 | Ht 66.0 in | Wt 154.0 lb

## 2017-09-20 DIAGNOSIS — R197 Diarrhea, unspecified: Secondary | ICD-10-CM

## 2017-09-20 MED ORDER — SODIUM CHLORIDE 0.9 % IV SOLN: 500.0000 mL | Freq: Once | INTRAVENOUS | Status: DC

## 2017-09-20 NOTE — Progress Notes (Signed)
Called to room to assist during endoscopic procedure.  Patient ID and intended procedure confirmed with present staff. Received instructions for my participation in the procedure from the performing physician.  

## 2017-09-20 NOTE — Progress Notes (Signed)
Pt's states no medical or surgical changes since previsit or office visit. 

## 2017-09-20 NOTE — Op Note (Signed)
[place] Patient Name: Angelica Guerrero Procedure Date: 09/20/2017 3:18 PM MRN: [id number] Endoscopist: Ladene Artist , MD Age: 71 Referring MD:  Date of Birth: 31-Jan-1946 Gender: Female Account #: [id number] Procedure:                Colonoscopy Indications:              Chronic diarrhea Medicines:                Monitored Anesthesia Care Procedure:                Pre-Anesthesia Assessment:                           - Prior to the procedure, a History and Physical                            was performed, and patient medications and                            allergies were reviewed. The patient's tolerance of                            previous anesthesia was also reviewed. The risks                            and benefits of the procedure and the sedation                            options and risks were discussed with the patient.                            All questions were answered, and informed consent                            was obtained. Prior Anticoagulants: The patient has                            taken no previous anticoagulant or antiplatelet                            agents. ASA Grade Assessment: II - A patient with                            mild systemic disease. After reviewing the risks                            and benefits, the patient was deemed in                            satisfactory condition to undergo the procedure.                           After obtaining informed consent, the colonoscope  was passed under direct vision. Throughout the                            procedure, the patient's blood pressure, pulse, and                            oxygen saturations were monitored continuously. The                            Model PCF-H190DL [id number]) scope was introduced                            through the anus and advanced to the the cecum,                            identified by appendiceal orifice and  ileocecal                            valve. The ileocecal valve, appendiceal orifice,                            and rectum were photographed. The quality of the                            bowel preparation was excellent. The patient                            tolerated the procedure well. The colonoscopy was                            somewhat difficult due to a tortuous sigmoid colon.                            Successful completion of the procedure was aided by                            changing the patient's position, using manual                            pressure and withdrawing and reinserting the scope. Scope In: 3:25:55 PM Scope Out: 3:52:55 PM Scope Withdrawal Time: 0 hours 15 minutes 23 seconds  Total Procedure Duration: 0 hours 27 minutes 0 seconds  Findings:                 The perianal and digital rectal examinations were                            normal.                           Several attempts to intubate the TI were not                            successful.  A few small-mouthed diverticula were found in the                            sigmoid colon. There was no evidence of                            diverticular bleeding.                           The exam was otherwise without abnormality on                            direct and retroflexion views. Random biopsies                            obtained throughout the colon. Complications:            No immediate complications. Estimated blood loss:                            None. Estimated Blood Loss:     Estimated blood loss: none. Impression:               - Mild diverticulosis in the sigmoid colon. There                            was no evidence of diverticular bleeding.                           - The examination was otherwise normal on direct                            and retroflexion views. Recommendation:           - Repeat colonoscopy in 10 years for screening                             purposes.                           - Patient has a contact number available for                            emergencies. The signs and symptoms of potential                            delayed complications were discussed with the                            patient. Return to normal activities tomorrow.                            Written discharge instructions were provided to the                            patient.                           -  Resume previous diet.                           - Continue present medications.                           - Await pathology results. Ladene Artist, MD 09/20/2017 3:59:33 PM This report has been signed electronically.

## 2017-09-20 NOTE — Progress Notes (Signed)
A/ox3 pleased with MAC, report to Sheila RN 

## 2017-09-20 NOTE — Patient Instructions (Signed)
YOU HAD AN ENDOSCOPIC PROCEDURE TODAY AT THE [facility]:   Refer to the procedure report that was given to you for any specific questions about what was found during the examination.  If the procedure report does not answer your questions, please call your gastroenterologist to clarify.  If you requested that your care partner not be given the details of your procedure findings, then the procedure report has been included in a sealed envelope for you to review at your convenience later.  YOU SHOULD EXPECT: Some feelings of bloating in the abdomen. Passage of more gas than usual.  Walking can help get rid of the air that was put into your GI tract during the procedure and reduce the bloating. If you had a lower endoscopy (such as a colonoscopy or flexible sigmoidoscopy) you may notice spotting of blood in your stool or on the toilet paper. If you underwent a bowel prep for your procedure, you may not have a normal bowel movement for a few days.  Please Note:  You might notice some irritation and congestion in your nose or some drainage.  This is from the oxygen used during your procedure.  There is no need for concern and it should clear up in a day or so.  SYMPTOMS TO REPORT IMMEDIATELY:   Following lower endoscopy (colonoscopy or flexible sigmoidoscopy):  Excessive amounts of blood in the stool  Significant tenderness or worsening of abdominal pains  Swelling of the abdomen that is new, acute  Fever of 100F or higher    For urgent or emergent issues, a gastroenterologist can be reached at any hour by calling [phone].   DIET:  We do recommend a small meal at first, but then you may proceed to your regular diet.  Drink plenty of fluids but you should avoid alcoholic beverages for 24 hours.  ACTIVITY:  You should plan to take it easy for the rest of today and you should NOT DRIVE or use heavy machinery until tomorrow (because of the sedation medicines used during the test).     FOLLOW UP: Our staff will call the number listed on your records the next business day following your procedure to check on you and address any questions or concerns that you may have regarding the information given to you following your procedure. If we do not reach you, we will leave a message.  However, if you are feeling well and you are not experiencing any problems, there is no need to return our call.  We will assume that you have returned to your regular daily activities without incident.  If any biopsies were taken you will be contacted by phone or by letter within the next 1-3 weeks.  Please call us at [phone] if you have not heard about the biopsies in 3 weeks.    SIGNATURES/CONFIDENTIALITY: You and/or your care partner have signed paperwork which will be entered into your electronic medical record.  These signatures attest to the fact that that the information above on your After Visit Summary has been reviewed and is understood.  Full responsibility of the confidentiality of this discharge information lies with you and/or your care-partner.   Resume medications. Information given on diverticulosis. 

## 2017-09-21 ENCOUNTER — Telehealth: Payer: Self-pay | Admitting: *Deleted

## 2017-09-21 NOTE — Telephone Encounter (Signed)
  Follow up Call-  Call back number 09/20/2017  Post procedure Call Back phone  # [phone]  Permission to leave phone message Yes  Some recent data might be hidden     Patient questions:  Do you have a fever, pain , or abdominal swelling? No. Pain Score  0 *  Have you tolerated food without any problems? Yes.    Have you been able to return to your normal activities? Yes.    Do you have any questions about your discharge instructions: Diet   No. Medications  No. Follow up visit  No.  Do you have questions or concerns about your Care? No.  Actions: * If pain score is 4 or above: No action needed, pain <4.

## 2017-09-27 ENCOUNTER — Telehealth: Payer: Self-pay | Admitting: *Deleted

## 2017-09-27 ENCOUNTER — Other Ambulatory Visit: Payer: Self-pay

## 2017-09-27 DIAGNOSIS — R197 Diarrhea, unspecified: Secondary | ICD-10-CM

## 2017-09-27 NOTE — Telephone Encounter (Signed)
  Follow up Call-  Call back number 09/20/2017  Post procedure Call Back phone  # [phone]  Permission to leave phone message Yes  Some recent data might be hidden     Patient questions:  Message left to call us if necessary.  Second call.

## 2017-10-04 ENCOUNTER — Other Ambulatory Visit: Payer: Medicare Other

## 2017-11-02 ENCOUNTER — Other Ambulatory Visit: Payer: Medicare Other

## 2017-11-02 DIAGNOSIS — R197 Diarrhea, unspecified: Secondary | ICD-10-CM

## 2017-11-09 LAB — GASTROINTESTINAL PATHOGEN PANEL PCR
C. difficile Tox A/B, PCR: UNDETERMINED — AB
CRYPTOSPORIDIUM, PCR: UNDETERMINED — AB
Campylobacter, PCR: UNDETERMINED — AB
E COLI (ETEC) LT/ST, PCR: UNDETERMINED — AB
E coli (STEC) stx1/stx2, PCR: UNDETERMINED — AB
E coli 0157, PCR: UNDETERMINED — AB
Giardia lamblia, PCR: UNDETERMINED — AB
NOROVIRUS, PCR: UNDETERMINED — AB
ROTAVIRUS, PCR: UNDETERMINED — AB
SALMONELLA, PCR: UNDETERMINED — AB
SHIGELLA, PCR: UNDETERMINED — AB

## 2017-11-10 ENCOUNTER — Other Ambulatory Visit: Payer: Self-pay

## 2017-11-10 DIAGNOSIS — R197 Diarrhea, unspecified: Secondary | ICD-10-CM

## 2017-11-15 ENCOUNTER — Other Ambulatory Visit: Payer: Medicare Other

## 2017-12-01 ENCOUNTER — Other Ambulatory Visit: Payer: Self-pay

## 2017-12-01 MED ORDER — ALOSETRON HCL 0.5 MG PO TABS: 0.5000 mg | ORAL_TABLET | Freq: Two times a day (BID) | ORAL | 3 refills | Status: DC

## 2018-01-31 DIAGNOSIS — H903 Sensorineural hearing loss, bilateral: Secondary | ICD-10-CM | POA: Diagnosis not present

## 2018-02-13 DIAGNOSIS — K589 Irritable bowel syndrome without diarrhea: Secondary | ICD-10-CM | POA: Diagnosis not present

## 2018-02-13 DIAGNOSIS — G3184 Mild cognitive impairment, so stated: Secondary | ICD-10-CM | POA: Diagnosis not present

## 2018-02-13 DIAGNOSIS — F320 Major depressive disorder, single episode, mild: Secondary | ICD-10-CM | POA: Diagnosis not present

## 2018-02-13 DIAGNOSIS — R413 Other amnesia: Secondary | ICD-10-CM | POA: Diagnosis not present

## 2018-02-21 DIAGNOSIS — M519 Unspecified thoracic, thoracolumbar and lumbosacral intervertebral disc disorder: Secondary | ICD-10-CM | POA: Diagnosis not present

## 2018-02-21 DIAGNOSIS — M542 Cervicalgia: Secondary | ICD-10-CM | POA: Diagnosis not present

## 2018-02-28 DIAGNOSIS — M542 Cervicalgia: Secondary | ICD-10-CM | POA: Diagnosis not present

## 2018-03-07 ENCOUNTER — Other Ambulatory Visit: Payer: Self-pay

## 2018-03-07 MED ORDER — ALOSETRON HCL 0.5 MG PO TABS: 0.5000 mg | ORAL_TABLET | Freq: Two times a day (BID) | ORAL | 3 refills | Status: DC

## 2018-04-23 ENCOUNTER — Other Ambulatory Visit: Payer: Self-pay

## 2018-04-23 MED ORDER — ALOSETRON HCL 0.5 MG PO TABS: 0.5000 mg | ORAL_TABLET | Freq: Two times a day (BID) | ORAL | 3 refills | Status: DC

## 2018-05-04 MED FILL — ALOSETRON HCL 0.5 MG TABLET: 0.5 | 15 days supply | Qty: 30 | Fill #0

## 2018-05-23 DIAGNOSIS — M519 Unspecified thoracic, thoracolumbar and lumbosacral intervertebral disc disorder: Secondary | ICD-10-CM | POA: Diagnosis not present

## 2018-05-23 DIAGNOSIS — M542 Cervicalgia: Secondary | ICD-10-CM | POA: Diagnosis not present

## 2018-07-05 DIAGNOSIS — M5412 Radiculopathy, cervical region: Secondary | ICD-10-CM | POA: Diagnosis not present

## 2018-07-05 DIAGNOSIS — M519 Unspecified thoracic, thoracolumbar and lumbosacral intervertebral disc disorder: Secondary | ICD-10-CM | POA: Diagnosis not present

## 2018-07-06 ENCOUNTER — Telehealth: Payer: Self-pay

## 2018-07-06 NOTE — Telephone Encounter (Signed)
Received call from Pt's husband.  Per husband-Pt needs an urgent MRI and needs ok d/t Pt with loop.  This nurse placed called to [facility] [phone] ext [phone] and left message notifying ok for MRI with loop. Gave fax # if documentation needed.  Await further needs.

## 2018-07-09 ENCOUNTER — Other Ambulatory Visit (HOSPITAL_COMMUNITY): Payer: Self-pay | Admitting: Specialist

## 2018-07-09 DIAGNOSIS — M519 Unspecified thoracic, thoracolumbar and lumbosacral intervertebral disc disorder: Secondary | ICD-10-CM

## 2018-07-13 ENCOUNTER — Ambulatory Visit (HOSPITAL_COMMUNITY)
Admission: RE | Admit: 2018-07-13 | Discharge: 2018-07-13 | Disposition: A | Discharge status: Home / Self Care | Payer: Medicare Other | Source: Ambulatory Visit | Attending: Specialist | Admitting: Specialist

## 2018-07-13 DIAGNOSIS — M519 Unspecified thoracic, thoracolumbar and lumbosacral intervertebral disc disorder: Secondary | ICD-10-CM | POA: Diagnosis not present

## 2018-07-13 DIAGNOSIS — M542 Cervicalgia: Secondary | ICD-10-CM | POA: Insufficient documentation

## 2018-07-13 DIAGNOSIS — M50321 Other cervical disc degeneration at C4-C5 level: Secondary | ICD-10-CM | POA: Insufficient documentation

## 2018-07-17 DIAGNOSIS — M5412 Radiculopathy, cervical region: Secondary | ICD-10-CM | POA: Diagnosis not present

## 2018-07-17 DIAGNOSIS — M542 Cervicalgia: Secondary | ICD-10-CM | POA: Diagnosis not present

## 2018-07-31 DIAGNOSIS — M5412 Radiculopathy, cervical region: Secondary | ICD-10-CM | POA: Diagnosis not present

## 2018-08-16 DIAGNOSIS — M5412 Radiculopathy, cervical region: Secondary | ICD-10-CM | POA: Diagnosis not present

## 2018-08-16 DIAGNOSIS — M542 Cervicalgia: Secondary | ICD-10-CM | POA: Diagnosis not present

## 2018-08-21 ENCOUNTER — Ambulatory Visit: Payer: Medicare Other | Admitting: Gastroenterology

## 2018-08-21 DIAGNOSIS — R413 Other amnesia: Secondary | ICD-10-CM | POA: Diagnosis not present

## 2018-08-21 DIAGNOSIS — F900 Attention-deficit hyperactivity disorder, predominantly inattentive type: Secondary | ICD-10-CM | POA: Diagnosis not present

## 2018-08-21 DIAGNOSIS — G3184 Mild cognitive impairment, so stated: Secondary | ICD-10-CM | POA: Diagnosis not present

## 2018-08-30 DIAGNOSIS — E7849 Other hyperlipidemia: Secondary | ICD-10-CM | POA: Diagnosis not present

## 2018-08-30 DIAGNOSIS — R7301 Impaired fasting glucose: Secondary | ICD-10-CM | POA: Diagnosis not present

## 2018-08-30 DIAGNOSIS — R82998 Other abnormal findings in urine: Secondary | ICD-10-CM | POA: Diagnosis not present

## 2018-09-06 DIAGNOSIS — R32 Unspecified urinary incontinence: Secondary | ICD-10-CM | POA: Diagnosis not present

## 2018-09-06 DIAGNOSIS — E7849 Other hyperlipidemia: Secondary | ICD-10-CM | POA: Diagnosis not present

## 2018-09-06 DIAGNOSIS — Z0000 Encounter for general adult medical examination without abnormal findings: Secondary | ICD-10-CM | POA: Diagnosis not present

## 2018-09-06 DIAGNOSIS — R7301 Impaired fasting glucose: Secondary | ICD-10-CM | POA: Diagnosis not present

## 2018-09-06 DIAGNOSIS — Z6827 Body mass index (BMI) 27.0-27.9, adult: Secondary | ICD-10-CM | POA: Diagnosis not present

## 2018-09-06 DIAGNOSIS — F325 Major depressive disorder, single episode, in full remission: Secondary | ICD-10-CM | POA: Diagnosis not present

## 2018-09-06 DIAGNOSIS — G43909 Migraine, unspecified, not intractable, without status migrainosus: Secondary | ICD-10-CM | POA: Diagnosis not present

## 2018-09-06 DIAGNOSIS — Z1389 Encounter for screening for other disorder: Secondary | ICD-10-CM | POA: Diagnosis not present

## 2018-09-06 DIAGNOSIS — F419 Anxiety disorder, unspecified: Secondary | ICD-10-CM | POA: Diagnosis not present

## 2018-09-06 DIAGNOSIS — G4709 Other insomnia: Secondary | ICD-10-CM | POA: Diagnosis not present

## 2018-09-06 DIAGNOSIS — G3184 Mild cognitive impairment, so stated: Secondary | ICD-10-CM | POA: Diagnosis not present

## 2018-09-06 DIAGNOSIS — Z23 Encounter for immunization: Secondary | ICD-10-CM | POA: Diagnosis not present

## 2018-09-06 DIAGNOSIS — G4733 Obstructive sleep apnea (adult) (pediatric): Secondary | ICD-10-CM | POA: Diagnosis not present

## 2018-09-11 DIAGNOSIS — M5412 Radiculopathy, cervical region: Secondary | ICD-10-CM | POA: Diagnosis not present

## 2018-11-06 DIAGNOSIS — L503 Dermatographic urticaria: Secondary | ICD-10-CM | POA: Diagnosis not present

## 2018-12-27 DIAGNOSIS — G4733 Obstructive sleep apnea (adult) (pediatric): Secondary | ICD-10-CM | POA: Diagnosis not present

## 2018-12-27 DIAGNOSIS — J029 Acute pharyngitis, unspecified: Secondary | ICD-10-CM | POA: Diagnosis not present

## 2018-12-27 DIAGNOSIS — B370 Candidal stomatitis: Secondary | ICD-10-CM | POA: Diagnosis not present

## 2018-12-27 DIAGNOSIS — K219 Gastro-esophageal reflux disease without esophagitis: Secondary | ICD-10-CM | POA: Diagnosis not present

## 2018-12-27 DIAGNOSIS — R05 Cough: Secondary | ICD-10-CM | POA: Diagnosis not present

## 2019-01-10 DIAGNOSIS — K219 Gastro-esophageal reflux disease without esophagitis: Secondary | ICD-10-CM | POA: Diagnosis not present

## 2019-02-07 DIAGNOSIS — L509 Urticaria, unspecified: Secondary | ICD-10-CM | POA: Diagnosis not present

## 2019-02-07 DIAGNOSIS — Z79899 Other long term (current) drug therapy: Secondary | ICD-10-CM | POA: Diagnosis not present

## 2019-02-07 DIAGNOSIS — L503 Dermatographic urticaria: Secondary | ICD-10-CM | POA: Diagnosis not present

## 2019-02-28 DIAGNOSIS — L503 Dermatographic urticaria: Secondary | ICD-10-CM | POA: Diagnosis not present

## 2019-03-08 DIAGNOSIS — G4733 Obstructive sleep apnea (adult) (pediatric): Secondary | ICD-10-CM | POA: Diagnosis not present

## 2019-03-08 DIAGNOSIS — M1990 Unspecified osteoarthritis, unspecified site: Secondary | ICD-10-CM | POA: Diagnosis not present

## 2019-03-08 DIAGNOSIS — G3184 Mild cognitive impairment, so stated: Secondary | ICD-10-CM | POA: Diagnosis not present

## 2019-03-08 DIAGNOSIS — R7301 Impaired fasting glucose: Secondary | ICD-10-CM | POA: Diagnosis not present

## 2019-03-08 DIAGNOSIS — F325 Major depressive disorder, single episode, in full remission: Secondary | ICD-10-CM | POA: Diagnosis not present

## 2019-03-08 DIAGNOSIS — E785 Hyperlipidemia, unspecified: Secondary | ICD-10-CM | POA: Diagnosis not present

## 2019-03-08 DIAGNOSIS — L503 Dermatographic urticaria: Secondary | ICD-10-CM | POA: Diagnosis not present

## 2019-03-08 DIAGNOSIS — R197 Diarrhea, unspecified: Secondary | ICD-10-CM | POA: Diagnosis not present

## 2019-03-08 DIAGNOSIS — K219 Gastro-esophageal reflux disease without esophagitis: Secondary | ICD-10-CM | POA: Diagnosis not present

## 2019-03-08 DIAGNOSIS — J029 Acute pharyngitis, unspecified: Secondary | ICD-10-CM | POA: Diagnosis not present

## 2019-03-13 ENCOUNTER — Other Ambulatory Visit: Payer: Self-pay

## 2019-03-13 DIAGNOSIS — R197 Diarrhea, unspecified: Secondary | ICD-10-CM

## 2019-03-18 ENCOUNTER — Other Ambulatory Visit: Payer: Medicare Other

## 2019-03-28 ENCOUNTER — Telehealth: Payer: Self-pay

## 2019-03-28 NOTE — Telephone Encounter (Signed)
Covid-19 screening questions  Have you traveled in the last 14 days? If yes where?  No   Do you now or have you had a fever in the last 14 days? No   Do you have any respiratory symptoms of shortness of breath or cough now or in the last 14 days? No  Do you have any family members or close contacts with diagnosed or suspected Covid-19 in the past 14 days? No   Have you been tested for Covid-19 and found to be positive? No        

## 2019-03-29 ENCOUNTER — Encounter: Payer: Self-pay | Admitting: Gastroenterology

## 2019-03-29 ENCOUNTER — Other Ambulatory Visit: Payer: Medicare Other

## 2019-03-29 ENCOUNTER — Other Ambulatory Visit (INDEPENDENT_AMBULATORY_CARE_PROVIDER_SITE_OTHER): Payer: Medicare Other

## 2019-03-29 ENCOUNTER — Ambulatory Visit: Payer: Self-pay | Admitting: Gastroenterology

## 2019-03-29 ENCOUNTER — Ambulatory Visit (INDEPENDENT_AMBULATORY_CARE_PROVIDER_SITE_OTHER): Payer: Medicare Other | Admitting: Gastroenterology

## 2019-03-29 ENCOUNTER — Other Ambulatory Visit: Payer: Self-pay

## 2019-03-29 VITALS — BP 106/70 | HR 80 | Temp 98.2°F | Ht 65.5 in | Wt 165.4 lb

## 2019-03-29 DIAGNOSIS — K219 Gastro-esophageal reflux disease without esophagitis: Secondary | ICD-10-CM

## 2019-03-29 DIAGNOSIS — R197 Diarrhea, unspecified: Secondary | ICD-10-CM | POA: Diagnosis not present

## 2019-03-29 LAB — BASIC METABOLIC PANEL
BUN: 16 mg/dL (ref 6–23)
CO2: 26 mEq/L (ref 19–32)
Calcium: 9.2 mg/dL (ref 8.4–10.5)
Chloride: 106 mEq/L (ref 96–112)
Creatinine, Ser: 1 mg/dL (ref 0.40–1.20)
GFR: 54.43 mL/min — ABNORMAL LOW (ref >60.00)
Glucose, Bld: 84 mg/dL (ref 70–99)
Potassium: 4 mEq/L (ref 3.5–5.1)
Sodium: 141 mEq/L (ref 135–145)

## 2019-03-29 MED ORDER — ALOSETRON HCL 0.5 MG PO TABS: 0.5000 mg | ORAL_TABLET | Freq: Two times a day (BID) | ORAL | 3 refills | Status: DC

## 2019-03-29 MED ORDER — ESOMEPRAZOLE MAGNESIUM 40 MG PO CPDR: 40.0000 mg | DELAYED_RELEASE_CAPSULE | Freq: Two times a day (BID) | ORAL | 2 refills | Status: DC

## 2019-03-29 MED ORDER — FAMOTIDINE 40 MG PO TABS: 40.0000 mg | ORAL_TABLET | Freq: Every day | ORAL | 3 refills | Status: DC

## 2019-03-29 NOTE — Patient Instructions (Addendum)
You have been given a testing kit to check for small intestine bacterial overgrowth (SIBO) which is completed by a company named [employer]. Make sure to return your test in the mail using the return mailing label given you along with the kit. Your demographic and insurance information have already been sent to the company and they should be in contact with you over the next week regarding this test. Please keep in mind that you will be getting a call from phone number [phone] or a similar number. If you do not hear from them within this time frame, please call our office at [phone].   You have been scheduled for an endoscopy. Please follow written instructions given to you at your visit today. If you use inhalers (even only as needed), please bring them with you on the day of your procedure. Your physician has requested that you go to www.startemmi.com and enter the access code given to you at your visit today. This web site gives a general overview about your procedure. However, you should still follow specific instructions given to you by our office regarding your preparation for the procedure.  We have sent the following medications to your pharmacy for you to pick up at your convenience: Nexium, [place] are scheduled on 04/02/2019 at [facility] should arrive 15 minutes prior to your appointment time for registration. Please follow the written instructions below on the day of your exam:  WARNING: IF YOU ARE ALLERGIC TO IODINE/X-RAY DYE, PLEASE NOTIFY RADIOLOGY IMMEDIATELY AT [phone]! YOU WILL BE GIVEN A 13 HOUR PREMEDICATION PREP.  1) Do not eat or drink anything after 4:00am (4 hours prior to your test) 2) You have been given 2 bottles of oral contrast to drink. The solution may taste better if refrigerated, but do NOT add ice or any other liquid to this solution. Shake well before drinking.    Drink 1 bottle of contrast @ 6:00am (2 hours prior  to your exam)  Drink 1 bottle of contrast @ 7:00am (1 hour prior to your exam)  You may take any medications as prescribed with a small amount of water, if necessary. If you take any of the following medications: METFORMIN, GLUCOPHAGE, GLUCOVANCE, AVANDAMET, RIOMET, FORTAMET, [place] MET, JANUMET, GLUMETZA or METAGLIP, you MAY be asked to HOLD this medication 48 hours AFTER the exam.  The purpose of you drinking the oral contrast is to aid in the visualization of your intestinal tract. The contrast solution may cause some diarrhea. Depending on your individual set of symptoms, you may also receive an intravenous injection of x-ray contrast/dye. Plan on being at [facility] for 30 minutes or longer, depending on the type of exam you are having performed.  This test typically takes 30-45 minutes to complete.  If you have any questions regarding your exam or if you need to reschedule, you may call the CT department at [phone] between the hours of 8:00 am and 5:00 pm, Monday-Friday.  ________________________________________________________________________  Thank you for choosing me and [facility].  Pricilla Riffle. Dagoberto Ligas., MD., [facility]

## 2019-03-29 NOTE — Progress Notes (Signed)
    History of Present Illness: This is a 73 year old female with chronic diarrhea.  She is accompanied by her husband  Verl Blalock, MD.  They relate worsening of her diarrhea with occasional urgency and occasional episodes of incontinence.  She occasionally has nocturnal diarrhea.  Her diarrhea is more bothersome on certain days for unclear reasons.  Prior evaluation: colonoscopy to TI in 2012 with normal colon and normal TI biopsies, normal fecal elastase in 2013, CT AP negative in 2013,  tTG normal in 2017, colonoscopy in 09/2017 with random colon biopsies that were normal.  GI pathogen panel ordered in late May but not yet completed.  She is lactose intolerant and intolerant to sugar alcohols.  Her symptoms were previously controlled on Lotronex and Imodium.  She has GERD with LPR with a frequent cough and throat clearing.  Cough will often occur at night.  2 of her grandchildren have celiac disease.  She has recurrent urticaria of unclear etiology.  She initially was on an H2 blocker hoiwever this has been discontinued.  Denies weight loss, abdominal pain, constipation, change in stool caliber, melena, hematochezia, nausea, vomiting, dysphagia, reflux symptoms, chest pain.   Current Medications, Allergies, Past Medical History, Past Surgical History, Family History and Social History were reviewed in [employer] record.   Physical Exam: General: Well developed, well nourished, no acute distress Head: Normocephalic and atraumatic Eyes:  sclerae anicteric, EOMI Ears: Normal auditory acuity Mouth: No deformity or lesions Lungs: Clear throughout to auscultation Heart: Regular rate and rhythm; no murmurs, rubs or bruits Abdomen: Soft, non tender and non distended. No masses, hepatosplenomegaly or hernias noted. Normal Bowel sounds Rectal: Not done  Musculoskeletal: Symmetrical with no gross deformities  Pulses:  Normal pulses noted Extremities: No clubbing, cyanosis,  edema or deformities noted Neurological: Alert oriented x 4, grossly nonfocal Psychological:  Alert and cooperative. Normal mood and affect   Assessment and Recommendations:  1.  Worsening chronic diarrhea, etiology unclear.  Occasional incontinence.  Rule out SIBO, celiac disease, pancreatic insufficiency, etc.  GI pathogen panel. Schedule CT AP - R/O mass.  SIBO breath test.  Schedule EGD with duodenal biopsies. The risks (including bleeding, perforation, infection, missed lesions, medication reactions and possible hospitalization or surgery if complications occur), benefits, and alternatives to endoscopy with possible biopsy and possible dilation were discussed with the patient and they consent to proceed.  Continue Lotronex and Imodium for now.  2.  GERD with LPR, inadequately controlled.  Increase Nexium to 40 mg mg po bid before breakfast and dinner.  Closely follow antireflux measures.  Begin famotidine 40 mg at bedtime for control of GERD and urticaria.  Rule out esophagitis, Barrett's.  EGD as above.  3.  Lactose intolerance and sugar alcohol intolerance.  Ongoing avoidance of these dietary stressors is recommended.   cc: Shon Baton, MD       Rolm Bookbinder, MD

## 2019-04-02 ENCOUNTER — Encounter (HOSPITAL_COMMUNITY): Payer: Self-pay

## 2019-04-02 ENCOUNTER — Ambulatory Visit (HOSPITAL_COMMUNITY)
Admission: RE | Admit: 2019-04-02 | Discharge: 2019-04-02 | Disposition: A | Discharge status: Home / Self Care | Payer: Medicare Other | Source: Ambulatory Visit | Attending: Gastroenterology | Admitting: Gastroenterology

## 2019-04-02 ENCOUNTER — Other Ambulatory Visit: Payer: Self-pay

## 2019-04-02 DIAGNOSIS — K219 Gastro-esophageal reflux disease without esophagitis: Secondary | ICD-10-CM | POA: Diagnosis not present

## 2019-04-02 DIAGNOSIS — R197 Diarrhea, unspecified: Secondary | ICD-10-CM | POA: Insufficient documentation

## 2019-04-02 DIAGNOSIS — I700 Atherosclerosis of aorta: Secondary | ICD-10-CM | POA: Insufficient documentation

## 2019-04-02 DIAGNOSIS — K5730 Diverticulosis of large intestine without perforation or abscess without bleeding: Secondary | ICD-10-CM | POA: Insufficient documentation

## 2019-04-02 HISTORY — DX: Essential (primary) hypertension: I10

## 2019-04-02 MED ORDER — IOHEXOL 300 MG/ML  SOLN
100.0000 mL | Freq: Once | INTRAMUSCULAR | Status: AC | PRN
  Administered 2019-04-02: 100 mL via INTRAVENOUS

## 2019-04-02 MED ORDER — SODIUM CHLORIDE (PF) 0.9 % IJ SOLN
INTRAMUSCULAR | Status: AC
  Filled 2019-04-02: qty 50

## 2019-04-04 DIAGNOSIS — Z800 Family history of malignant neoplasm of digestive organs: Secondary | ICD-10-CM | POA: Diagnosis not present

## 2019-04-04 DIAGNOSIS — Z780 Asymptomatic menopausal state: Secondary | ICD-10-CM | POA: Diagnosis not present

## 2019-04-04 DIAGNOSIS — N83202 Unspecified ovarian cyst, left side: Secondary | ICD-10-CM | POA: Diagnosis not present

## 2019-04-04 LAB — GASTROINTESTINAL PATHOGEN PANEL PCR
C. difficile Tox A/B, PCR: UNDETERMINED — AB
Campylobacter, PCR: UNDETERMINED — AB
Cryptosporidium, PCR: UNDETERMINED — AB
E coli (ETEC) LT/ST PCR: UNDETERMINED — AB
E coli (STEC) stx1/stx2, PCR: UNDETERMINED — AB
E coli 0157, PCR: UNDETERMINED — AB
Giardia lamblia, PCR: UNDETERMINED — AB
Norovirus, PCR: UNDETERMINED — AB
Rotavirus A, PCR: UNDETERMINED — AB
Salmonella, PCR: UNDETERMINED — AB
Shigella, PCR: UNDETERMINED — AB

## 2019-04-05 ENCOUNTER — Other Ambulatory Visit: Payer: Self-pay

## 2019-04-05 DIAGNOSIS — R197 Diarrhea, unspecified: Secondary | ICD-10-CM

## 2019-04-08 ENCOUNTER — Telehealth: Payer: Self-pay

## 2019-04-08 NOTE — Telephone Encounter (Signed)
Initiated PA for Lotronex on cover my meds. Waiting on response.

## 2019-04-09 NOTE — Telephone Encounter (Signed)
Lotronex approved through cover my meds.

## 2019-05-03 ENCOUNTER — Telehealth: Payer: Self-pay | Admitting: Gastroenterology

## 2019-05-03 NOTE — Telephone Encounter (Signed)

## 2019-05-03 NOTE — Telephone Encounter (Signed)
Patient cll and answer no to all questions

## 2019-05-06 ENCOUNTER — Other Ambulatory Visit: Payer: Self-pay

## 2019-05-06 ENCOUNTER — Ambulatory Visit (AMBULATORY_SURGERY_CENTER): Payer: Medicare Other | Admitting: Gastroenterology

## 2019-05-06 ENCOUNTER — Encounter: Payer: Self-pay | Admitting: Gastroenterology

## 2019-05-06 VITALS — BP 149/82 | HR 69 | Temp 98.5°F | Resp 15 | Ht 65.5 in | Wt 165.0 lb

## 2019-05-06 DIAGNOSIS — K449 Diaphragmatic hernia without obstruction or gangrene: Secondary | ICD-10-CM

## 2019-05-06 DIAGNOSIS — R197 Diarrhea, unspecified: Secondary | ICD-10-CM

## 2019-05-06 DIAGNOSIS — K317 Polyp of stomach and duodenum: Secondary | ICD-10-CM | POA: Diagnosis not present

## 2019-05-06 DIAGNOSIS — K219 Gastro-esophageal reflux disease without esophagitis: Secondary | ICD-10-CM

## 2019-05-06 DIAGNOSIS — K31819 Angiodysplasia of stomach and duodenum without bleeding: Secondary | ICD-10-CM | POA: Diagnosis not present

## 2019-05-06 MED ORDER — SODIUM CHLORIDE 0.9 % IV SOLN: 500.0000 mL | Freq: Once | INTRAVENOUS | Status: DC

## 2019-05-06 NOTE — Progress Notes (Signed)
Called to room to assist during endoscopic procedure.  Patient ID and intended procedure confirmed with present staff. Received instructions for my participation in the procedure from the performing physician.  

## 2019-05-06 NOTE — Progress Notes (Addendum)
No problems noted in the recovery room. Maw  Dr. Fuller Plan spoke with Dr. Sharlett Iles, pt's husband,  in the recovery room re the EGd finding.  maw

## 2019-05-06 NOTE — Patient Instructions (Signed)
YOU HAD AN ENDOSCOPIC PROCEDURE TODAY AT [facility]:   Refer to the procedure report that was given to you for any specific questions about what was found during the examination.  If the procedure report does not answer your questions, please call your gastroenterologist to clarify.  If you requested that your care partner not be given the details of your procedure findings, then the procedure report has been included in a sealed envelope for you to review at your convenience later.  YOU SHOULD EXPECT: Some feelings of bloating in the abdomen. Passage of more gas than usual.  Walking can help get rid of the air that was put into your GI tract during the procedure and reduce the bloating. If you had a lower endoscopy (such as a colonoscopy or flexible sigmoidoscopy) you may notice spotting of blood in your stool or on the toilet paper. If you underwent a bowel prep for your procedure, you may not have a normal bowel movement for a few days.  Please Note:  You might notice some irritation and congestion in your nose or some drainage.  This is from the oxygen used during your procedure.  There is no need for concern and it should clear up in a day or so.  SYMPTOMS TO REPORT IMMEDIATELY:   Following upper endoscopy (EGD)  Vomiting of blood or coffee ground material  New chest pain or pain under the shoulder blades  Painful or persistently difficult swallowing  New shortness of breath  Fever of 100F or higher  Black, tarry-looking stools  For urgent or emergent issues, a gastroenterologist can be reached at any hour by calling [phone].   DIET:  We do recommend a small meal at first, but then you may proceed to your regular diet.  Drink plenty of fluids but you should avoid alcoholic beverages for 24 hours.  ACTIVITY:  You should plan to take it easy for the rest of today and you should NOT DRIVE or use heavy machinery until tomorrow (because of the sedation medicines used  during the test).    FOLLOW UP: Our staff will call the number listed on your records 48-72 hours following your procedure to check on you and address any questions or concerns that you may have regarding the information given to you following your procedure. If we do not reach you, we will leave a message.  We will attempt to reach you two times.  During this call, we will ask if you have developed any symptoms of COVID 19. If you develop any symptoms (ie: fever, flu-like symptoms, shortness of breath, cough etc.) before then, please call [phone].  If you test positive for Covid 19 in the 2 weeks post procedure, please call and report this information to [place].    If any biopsies were taken you will be contacted by phone or by letter within the next 1-3 weeks.  Please call us at [phone] if you have not heard about the biopsies in 3 weeks.    SIGNATURES/CONFIDENTIALITY: You and/or your care partner have signed paperwork which will be entered into your electronic medical record.  These signatures attest to the fact that that the information above on your After Visit Summary has been reviewed and is understood.  Full responsibility of the confidentiality of this discharge information lies with you and/or your care-partner.    Handout was given to you on a Hiatal Hernia You may resume your current medications today. Await biopsy results. Please  call if any questions or concerns.

## 2019-05-06 NOTE — Progress Notes (Signed)
PT taken to PACU. Monitors in place. VSS. Report given to RN. 

## 2019-05-06 NOTE — Op Note (Signed)
[place] Patient Name: Angelica Guerrero Procedure Date: 05/06/2019 1:42 PM MRN: [id number] Endoscopist: Ladene Artist , MD Age: 73 Referring MD:  Date of Birth: March 18, 1946 Gender: Female Account #: [id number] Procedure:                Upper GI endoscopy Indications:              Gastroesophageal reflux disease, Diarrhea, Family                            history of celiac disease. Medicines:                Monitored Anesthesia Care Procedure:                Pre-Anesthesia Assessment:                           - Prior to the procedure, a History and Physical                            was performed, and patient medications and                            allergies were reviewed. The patient's tolerance of                            previous anesthesia was also reviewed. The risks                            and benefits of the procedure and the sedation                            options and risks were discussed with the patient.                            All questions were answered, and informed consent                            was obtained. Prior Anticoagulants: The patient has                            taken no previous anticoagulant or antiplatelet                            agents. ASA Grade Assessment: II - A patient with                            mild systemic disease. After reviewing the risks                            and benefits, the patient was deemed in                            satisfactory condition to undergo the procedure.  After obtaining informed consent, the endoscope was                            passed under direct vision. Throughout the                            procedure, the patient's blood pressure, pulse, and                            oxygen saturations were monitored continuously. The                            Endoscope was introduced through the mouth, and                            advanced to the second  part of duodenum. The upper                            GI endoscopy was accomplished without difficulty.                            The patient tolerated the procedure well. Scope In: Scope Out: Findings:                 A single area of ectopic gastric mucosa was found                            in the proximal esophagus, 17 cm from the incisors.                            Biopsies were taken with a cold forceps for                            histology. Verification of patient identification                            for the specimen was done by the physician, nurse.                           The exam of the esophagus was otherwise normal.                           Four 3 to 4 mm sessile polyps with no bleeding and                            no stigmata of recent bleeding were found in the                            gastric body. Biopsies were taken with a cold                            forceps for histology. Verification of patient  identification for the specimen was done by the                            physician, nurse.                           A small hiatal hernia was present.                           The exam of the stomach was otherwise normal.                           The duodenal bulb and second portion of the                            duodenum were normal. Biopsies for histology were                            taken with a cold forceps for evaluation of celiac                            disease. Verification of patient identification for                            the specimen was done by the physician, nurse.                           A single 3 mm angiodysplastic lesion without                            bleeding was found in the second portion of the                            duodenum. Complications:            No immediate complications. Estimated Blood Loss:     Estimated blood loss: none. Impression:               - Ectopic gastric  mucosa in the proximal esophagus.                            Biopsied.                           - Four gastric polyps. Biopsied.                           - Small hiatal hernia.                           - Small duodenal AVM.                           - Normal duodenal bulb and second portion of the  duodenum. Biopsied. Recommendation:           - Patient has a contact number available for                            emergencies. The signs and symptoms of potential                            delayed complications were discussed with the                            patient. Return to normal activities tomorrow.                            Written discharge instructions were provided to the                            patient.                           - Resume previous diet.                           - Continue present medications.                           - Await pathology results. Ladene Artist, MD 05/06/2019 4:06:48 PM This report has been signed electronically.

## 2019-05-06 NOTE — Progress Notes (Signed)
Fayrene Fearing took temp ans Rica Mote took vitals.

## 2019-05-08 ENCOUNTER — Telehealth: Payer: Self-pay

## 2019-05-08 DIAGNOSIS — N958 Other specified menopausal and perimenopausal disorders: Secondary | ICD-10-CM | POA: Diagnosis not present

## 2019-05-08 DIAGNOSIS — N83201 Unspecified ovarian cyst, right side: Secondary | ICD-10-CM | POA: Diagnosis not present

## 2019-05-08 DIAGNOSIS — Z1231 Encounter for screening mammogram for malignant neoplasm of breast: Secondary | ICD-10-CM | POA: Diagnosis not present

## 2019-05-08 NOTE — Telephone Encounter (Signed)
Left message to call back later today, B.Isha Seefeld RN

## 2019-05-08 NOTE — Telephone Encounter (Signed)
  Follow up Call-  Call back number 05/06/2019 09/20/2017  Post procedure Call Back phone  # Shanon Brow [phone]  Permission to leave phone message - Yes  Some recent data might be hidden     Patient questions:  Do you have a fever, pain , or abdominal swelling? No. Pain Score  0 *  Have you tolerated food without any problems? Yes.    Have you been able to return to your normal activities? Yes.    Do you have any questions about your discharge instructions: Diet   No. Medications  No. Follow up visit  No.  Do you have questions or concerns about your Care? No.  Actions: * If pain score is 4 or above: No action needed, pain <4.  1. Have you developed a fever since your procedure? no  2.   Have you had an respiratory symptoms (SOB or cough) since your procedure? no  3.   Have you tested positive for COVID 19 since your procedure no  4.   Have you had any family members/close contacts diagnosed with the COVID 19 since your procedure?  no   If yes to any of these questions please route to Joylene John, RN and Alphonsa Gin, Therapist, sports.

## 2019-05-08 NOTE — Telephone Encounter (Signed)
Unable to take phone call, Craig RN

## 2019-05-13 ENCOUNTER — Other Ambulatory Visit: Payer: Self-pay | Admitting: Obstetrics and Gynecology

## 2019-05-13 DIAGNOSIS — R928 Other abnormal and inconclusive findings on diagnostic imaging of breast: Secondary | ICD-10-CM

## 2019-05-17 ENCOUNTER — Other Ambulatory Visit: Payer: Self-pay

## 2019-05-17 ENCOUNTER — Ambulatory Visit: Payer: Medicare Other

## 2019-05-17 ENCOUNTER — Ambulatory Visit
Admission: RE | Admit: 2019-05-17 | Discharge: 2019-05-17 | Disposition: A | Discharge status: Home / Self Care | Payer: Medicare Other | Source: Ambulatory Visit | Attending: Obstetrics and Gynecology | Admitting: Obstetrics and Gynecology

## 2019-05-17 DIAGNOSIS — R928 Other abnormal and inconclusive findings on diagnostic imaging of breast: Secondary | ICD-10-CM | POA: Diagnosis not present

## 2019-05-21 DIAGNOSIS — F900 Attention-deficit hyperactivity disorder, predominantly inattentive type: Secondary | ICD-10-CM | POA: Diagnosis not present

## 2019-05-21 DIAGNOSIS — K589 Irritable bowel syndrome without diarrhea: Secondary | ICD-10-CM | POA: Diagnosis not present

## 2019-05-21 DIAGNOSIS — G3184 Mild cognitive impairment, so stated: Secondary | ICD-10-CM | POA: Diagnosis not present

## 2019-05-21 DIAGNOSIS — R413 Other amnesia: Secondary | ICD-10-CM | POA: Diagnosis not present

## 2019-06-20 DIAGNOSIS — Z23 Encounter for immunization: Secondary | ICD-10-CM | POA: Diagnosis not present

## 2019-08-06 DIAGNOSIS — G3184 Mild cognitive impairment, so stated: Secondary | ICD-10-CM | POA: Diagnosis not present

## 2019-08-06 DIAGNOSIS — K589 Irritable bowel syndrome without diarrhea: Secondary | ICD-10-CM | POA: Diagnosis not present

## 2019-08-06 DIAGNOSIS — F900 Attention-deficit hyperactivity disorder, predominantly inattentive type: Secondary | ICD-10-CM | POA: Diagnosis not present

## 2019-08-06 DIAGNOSIS — R413 Other amnesia: Secondary | ICD-10-CM | POA: Diagnosis not present

## 2019-09-09 DIAGNOSIS — E7849 Other hyperlipidemia: Secondary | ICD-10-CM | POA: Diagnosis not present

## 2019-09-09 DIAGNOSIS — R7301 Impaired fasting glucose: Secondary | ICD-10-CM | POA: Diagnosis not present

## 2019-09-16 DIAGNOSIS — Z0000 Encounter for general adult medical examination without abnormal findings: Secondary | ICD-10-CM | POA: Diagnosis not present

## 2019-09-16 DIAGNOSIS — E785 Hyperlipidemia, unspecified: Secondary | ICD-10-CM | POA: Diagnosis not present

## 2019-09-16 DIAGNOSIS — R7301 Impaired fasting glucose: Secondary | ICD-10-CM | POA: Diagnosis not present

## 2019-09-16 DIAGNOSIS — L503 Dermatographic urticaria: Secondary | ICD-10-CM | POA: Diagnosis not present

## 2019-09-16 DIAGNOSIS — F325 Major depressive disorder, single episode, in full remission: Secondary | ICD-10-CM | POA: Diagnosis not present

## 2019-09-16 DIAGNOSIS — G3184 Mild cognitive impairment, so stated: Secondary | ICD-10-CM | POA: Diagnosis not present

## 2019-09-16 DIAGNOSIS — Z1331 Encounter for screening for depression: Secondary | ICD-10-CM | POA: Diagnosis not present

## 2019-09-16 DIAGNOSIS — G4700 Insomnia, unspecified: Secondary | ICD-10-CM | POA: Diagnosis not present

## 2019-09-16 DIAGNOSIS — F419 Anxiety disorder, unspecified: Secondary | ICD-10-CM | POA: Diagnosis not present

## 2019-09-16 DIAGNOSIS — G4733 Obstructive sleep apnea (adult) (pediatric): Secondary | ICD-10-CM | POA: Diagnosis not present

## 2019-09-16 DIAGNOSIS — J029 Acute pharyngitis, unspecified: Secondary | ICD-10-CM | POA: Diagnosis not present

## 2019-09-16 DIAGNOSIS — I700 Atherosclerosis of aorta: Secondary | ICD-10-CM | POA: Diagnosis not present

## 2019-10-29 DIAGNOSIS — R413 Other amnesia: Secondary | ICD-10-CM | POA: Diagnosis not present

## 2019-10-29 DIAGNOSIS — K589 Irritable bowel syndrome without diarrhea: Secondary | ICD-10-CM | POA: Diagnosis not present

## 2019-10-29 DIAGNOSIS — F900 Attention-deficit hyperactivity disorder, predominantly inattentive type: Secondary | ICD-10-CM | POA: Diagnosis not present

## 2019-11-06 ENCOUNTER — Ambulatory Visit: Payer: Medicare Other | Attending: Internal Medicine

## 2019-11-06 DIAGNOSIS — Z23 Encounter for immunization: Secondary | ICD-10-CM | POA: Diagnosis not present

## 2019-11-06 NOTE — Progress Notes (Signed)
   Covid-19 Vaccination Clinic  Name:  Angelica Guerrero    MRN: [id number] DOB: 01-18-1946  11/06/2019  [name] was observed post Covid-19 immunization for 15 minutes without incidence. She was provided with Vaccine Information Sheet and instruction to access the V-Safe system.   [name] was instructed to call 911 with any severe reactions post vaccine: Marland Kitchen Difficulty breathing  . Swelling of your face and throat  . A fast heartbeat  . A bad rash all over your body  . Dizziness and weakness    Immunizations Administered    Name Date Dose VIS Date Route   Pfizer COVID-19 Vaccine 11/06/2019  4:12 PM 0.3 mL 09/27/2019 Intramuscular   Manufacturer: [place]   Lot: [id number]   [place]: [id number]

## 2019-11-21 DIAGNOSIS — H25813 Combined forms of age-related cataract, bilateral: Secondary | ICD-10-CM | POA: Diagnosis not present

## 2019-11-21 DIAGNOSIS — H5203 Hypermetropia, bilateral: Secondary | ICD-10-CM | POA: Diagnosis not present

## 2019-11-21 DIAGNOSIS — D485 Neoplasm of uncertain behavior of skin: Secondary | ICD-10-CM | POA: Diagnosis not present

## 2019-11-21 DIAGNOSIS — H524 Presbyopia: Secondary | ICD-10-CM | POA: Diagnosis not present

## 2019-11-25 ENCOUNTER — Ambulatory Visit: Payer: Medicare Other | Attending: Internal Medicine

## 2019-11-25 DIAGNOSIS — Z23 Encounter for immunization: Secondary | ICD-10-CM | POA: Insufficient documentation

## 2019-11-25 NOTE — Progress Notes (Signed)
   Covid-19 Vaccination Clinic  Name:  Doreen Pilarczyk    MRN: [id number] DOB: 01-24-1946  11/25/2019  Ms. Hinote was observed post Covid-19 immunization for 15 minutes without incidence. She was provided with Vaccine Information Sheet and instruction to access the V-Safe system.   Ms. Leak was instructed to call 911 with any severe reactions post vaccine: Marland Kitchen Difficulty breathing  . Swelling of your face and throat  . A fast heartbeat  . A bad rash all over your body  . Dizziness and weakness    Immunizations Administered    Name Date Dose VIS Date Route   Pfizer COVID-19 Vaccine 11/25/2019  9:05 AM 0.3 mL 09/27/2019 Intramuscular   Manufacturer: [place]   Lot: [id number]   [place]: [id number]

## 2019-11-27 ENCOUNTER — Ambulatory Visit: Payer: Medicare Other

## 2019-12-11 ENCOUNTER — Other Ambulatory Visit: Payer: Self-pay | Admitting: Ophthalmology

## 2019-12-11 DIAGNOSIS — L570 Actinic keratosis: Secondary | ICD-10-CM | POA: Diagnosis not present

## 2019-12-11 DIAGNOSIS — D485 Neoplasm of uncertain behavior of skin: Secondary | ICD-10-CM | POA: Diagnosis not present

## 2019-12-11 DIAGNOSIS — D23122 Other benign neoplasm of skin of left lower eyelid, including canthus: Secondary | ICD-10-CM | POA: Diagnosis not present

## 2020-02-07 ENCOUNTER — Other Ambulatory Visit: Payer: Self-pay | Admitting: Gastroenterology

## 2020-02-24 ENCOUNTER — Other Ambulatory Visit: Payer: Self-pay | Admitting: Gastroenterology

## 2020-03-05 ENCOUNTER — Telehealth: Payer: Self-pay

## 2020-03-05 NOTE — Telephone Encounter (Signed)
Patient's husband called to report worsening GERD and possible aspiration.  He is requesting EGD.  Dr. Fuller Plan reviewed her chart and patient had EGD last July. He would favor MBS,and BS before EGD, but if patient and husband feel strongly okay to repeat EGD.  I relayed response to Dr. Sharlett Iles, he did not realize it had been less than a year.  He is going to try and monitor her medication intake of BID PPI better and will call back if he wants to discuss further.

## 2020-03-06 ENCOUNTER — Other Ambulatory Visit: Payer: Self-pay | Admitting: Gastroenterology

## 2020-03-06 NOTE — Telephone Encounter (Signed)
Agree with close compliance with Nexium 40 mg po bid for now.  If symptoms not adequately controlled consider office appt, MBSS, BA esophagram, adding famotidine 40 mg hs.

## 2020-03-23 ENCOUNTER — Encounter: Payer: Self-pay | Admitting: Neurology

## 2020-03-23 ENCOUNTER — Telehealth: Payer: Self-pay

## 2020-03-23 ENCOUNTER — Telehealth: Payer: Self-pay | Admitting: Neurology

## 2020-03-23 ENCOUNTER — Ambulatory Visit (INDEPENDENT_AMBULATORY_CARE_PROVIDER_SITE_OTHER): Payer: Medicare Other | Admitting: Neurology

## 2020-03-23 ENCOUNTER — Other Ambulatory Visit: Payer: Self-pay

## 2020-03-23 VITALS — BP 150/64 | HR 65 | Resp 20 | Ht 65.0 in | Wt 175.0 lb

## 2020-03-23 DIAGNOSIS — G301 Alzheimer's disease with late onset: Secondary | ICD-10-CM

## 2020-03-23 DIAGNOSIS — G459 Transient cerebral ischemic attack, unspecified: Secondary | ICD-10-CM

## 2020-03-23 DIAGNOSIS — F0280 Dementia in other diseases classified elsewhere without behavioral disturbance: Secondary | ICD-10-CM | POA: Diagnosis not present

## 2020-03-23 NOTE — Telephone Encounter (Signed)
Dr Sharlett Iles called they will be here around 2-2:30. He still said that she does NOT see a neurologist, when I told him that's what a memory care MD is, he told me that they do nothing neurological

## 2020-03-23 NOTE — Progress Notes (Unsigned)
Dr.David Sharlett Iles, called requesting his wife to be seen today or tomorrow, doesn't feel the need to go to ER. concerened she may have had TIA. No confusion with patient today. Wants to speak with a Doctor here at [facility] to get this arranged for an office visit. Please advise. [phone].

## 2020-03-23 NOTE — Telephone Encounter (Signed)
Dr. Rutherford Limerick called wanting to speak with one of the Dr's here, Wants his wife seen today or tomorrow. Possible TIA, no confusion today. Please call [phone]. Advise please

## 2020-03-23 NOTE — Telephone Encounter (Signed)
Pt has not been seen at our practice since 2015.  Is there a reason that they didn't call her neurologist since she is actively being seen by one?  It might be easier since that person knows her

## 2020-03-23 NOTE — Progress Notes (Signed)
Assessment/Plan:   1.  Probable TIA  -history is not that good but sounds more like TIA than TGA.    -We discussed the diagnosis as well as pathophysology of the disease.  We discussed signs and sx's of stroke and importance of calling 911 immediately should she have any of these.  Patient education was provided.    -Talked about stroke risk factors which include osas (untreated),  HTN, hyperlipidemia, previous hx of stroke, age.  Talked about those risk factors which are modifiable.  -Talked about importance of blood pressure control with a goal <130/80 mm Hg.   -Talked about importance of lipid control and proper diet.  Lipids should be managed intensively, with a goal LDL < 70 mg/dL.  She is on lipitor but hasn't been checked in a few months.  We will check it fasting.  -I counseled the patient on measures to reduce stroke risk, including the importance of medication compliance, risk factor control, exercise, healthy diet, and avoidance of smoking.    -will check carotid u/s  -will check echo  -will do mri  -discussed the importance of calling 911 with any future s/s  -will send copy to pts doctor in [place] at patient request, Dr. Oletta Lamas  2.  AD  -on namenda, 5 mg daily.    -seen in [facility]  Subjective:   Angelica Guerrero was seen today in neurologic consultation.  Pt seen as a work in today, requested by her husband who is a retired Engineer, drilling.  The consultation is for the evaluation of possible TIA.  This patient is accompanied in the office by her spouse who supplements the history.  The patient is a 74 y.o. year old female with a history of known memory change/dementia.  Pt sees a memory d/o physician in [place] (Dr. Irene Pap) and is on memantine (pt supposed to take bid but only takes qd).  On Sundays morning, pt woke up and pt had trouble with garbled speech.  Her husband states that it was like that most of the day but patient states that she did talk with the daughter.   No lateralizing weakness or paresthesias.  Pt was able to feed herself yesterday.  Was able to swallow.  Husband felt was a "little unsteady" but pt not so sure.  No diplopia.  Pts husband did feel that she was repetitive as well as slurred.  When she woke up this AM, she was normal and back baseline.    At baseline, she cooks and drives on a limited bases.  She no longer does the finances.  Husband makes a pill chart and she otherwise distributes her meds.  Pt does have OSAS, noncompliant with cpap.  She is trying to get back to using that again.   ALLERGIES:   Allergies  Allergen Reactions  . Lactose Other (See Comments)  . Poison Ivy Extract Other (See Comments)  . Lactose Intolerance (Gi) Diarrhea  . Latex   . Morphine And Related Itching and Nausea Only    CURRENT MEDICATIONS:  Outpatient Encounter Medications as of 03/23/2020  Medication Sig  . alosetron (LOTRONEX) 0.5 MG tablet Take 1 tablet (0.5 mg total) by mouth 2 (two) times daily.  Marland Kitchen esomeprazole (NEXIUM) 40 MG capsule TAKE ONE CAPSULE TWICE DAILY BEFORE MEALS  . famotidine (PEPCID) 40 MG tablet TAKE ONE TABLET AT BEDTIME  . fexofenadine (ALLEGRA) 180 MG tablet Take 180 mg by mouth every morning.  . hydrOXYzine (ATARAX/VISTARIL) 10 MG tablet Take 10  mg by mouth at bedtime.  Marland Kitchen lisinopril (PRINIVIL,ZESTRIL) 5 MG tablet Take 5 mg by mouth daily.  . Loperamide HCl (IMODIUM PO) Take 1 tablet by mouth daily.  . memantine (NAMENDA) 5 MG tablet Take 5 mg by mouth 2 (two) times daily.  . rosuvastatin (CRESTOR) 40 MG tablet Take 40 mg by mouth daily.  Marland Kitchen buPROPion (WELLBUTRIN XL) 150 MG 24 hr tablet Take 150 mg by mouth daily.  Marland Kitchen FLUoxetine (PROZAC) 40 MG capsule Take 40 mg by mouth daily.   . [DISCONTINUED] predniSONE (DELTASONE) 10 MG tablet Take 10 mg by mouth as needed.   No facility-administered encounter medications on file as of 03/23/2020.    Objective:   PHYSICAL EXAMINATION:    VITALS:   Vitals:   03/23/20 1517  BP:  (!) 150/64  Pulse: 65  Resp: 20  SpO2: 96%  Weight: 175 lb (79.4 kg)  Height: 5\' 5"  (1.651 m)    GEN:  Normal appears female in no acute distress.  Appears stated age. HEENT:  Normocephalic, atraumatic. The mucous membranes are moist. The superficial temporal arteries are without ropiness or tenderness. Cardiovascular: Regular rate and rhythm. Lungs: Clear to auscultation bilaterally. Neck/Heme: There are no carotid bruits noted bilaterally.  NEUROLOGICAL: Orientation:  The patient is alert and oriented x 3.  Needs assist with finer aspects of the history. Cranial nerves: There is good facial symmetry.  Extraocular muscles are intact and visual fields are full to confrontational testing. Speech is fluent and clear. Soft palate rises symmetrically and there is no tongue deviation. Hearing is intact to conversational tone. Tone: Tone is good throughout. Sensation: Sensation is intact to light touch throughout (facial, trunk, extremities). Vibration is intact at the bilateral big toe. There is no extinction with double simultaneous stimulation. There is no sensory dermatomal level identified. Coordination:  The patient has no difficulty with RAM's or FNF bilaterally. Motor: Strength is 5/5 in the bilateral upper and lower extremities.  Shoulder shrug is equal and symmetric. There is no pronator drift.  There are no fasciculations noted. DTR's: Deep tendon reflexes are 2+-3-/4 at the bilateral biceps, triceps, brachioradialis, patella and achilles.  Plantar responses are downgoing bilaterally. Gait and Station: The patient is able to ambulate without difficulty. The patient is able to heel toe walk without any difficulty. The patient is able to ambulate in a tandem fashion. The patient is able to stand in the Romberg position.    Total time spent on today's visit was 60 minutes, including both face-to-face time and nonface-to-face time.  Time included that spent on review of records (prior  notes available to me/labs/imaging if pertinent), discussing treatment and goals, answering patient's questions and coordinating care.   Cc:  Shon Baton, MD

## 2020-03-23 NOTE — Telephone Encounter (Signed)
Trying to avoid a trip to Emergency room. Dr Sharlett Iles requesting his wife Lorice be seen today. Please call back to [phone] as soon as possible.

## 2020-03-23 NOTE — Telephone Encounter (Signed)
Can we get some details please.  Lateralizing weakness/paresthesias.  Speech change?  Back to baseline today?

## 2020-03-23 NOTE — Telephone Encounter (Signed)
Spoke to Dr Sharlett Iles, Mrs Angelica Guerrero is back to base line, no headache, no slurred speech, no weakness, he stated he tried to take her to the ER yesterday she would no go.  He said if she can not be seen today can she be seen tomorrow?  She does have dementia seen in [place] on Memantine BID

## 2020-03-23 NOTE — Telephone Encounter (Signed)
Not a neurologist its a memory care MD

## 2020-03-23 NOTE — Telephone Encounter (Signed)
Left message for patient to call office back, regarding symptoms.

## 2020-03-23 NOTE — Patient Instructions (Signed)
1.  Start Aspirin, coated, 81 mg daily 2.  We have sent a referral to [facility] for your MRI and they will call you directly to schedule your appointment. They are located at [place]. If you need to contact them directly please call [phone]. 3. Your provider has requested that you have labwork completed but you need to be fasting.  Please call us when you plan to have it done so that we know when to check you into the lab.  The lab is within [facility] ([address]) on the second floor of this building but we will have to check you in. 4.  If you experience ANY other neuro symptoms such as you had, call 911 and go to the ER  The physicians and staff at [facility] are committed to providing excellent care. You may receive a survey requesting feedback about your experience at our office. We strive to receive "very good" responses to the survey questions. If you feel that your experience would prevent you from giving the office a "very good " response, please contact our office to try to remedy the situation. We may be reached at [phone]. Thank you for taking the time out of your busy day to complete the survey.

## 2020-03-23 NOTE — Telephone Encounter (Signed)
That would be a neurologist but tell them to come in and someone will see her but they may have to wait a bit

## 2020-04-02 ENCOUNTER — Telehealth: Payer: Self-pay | Admitting: Neurology

## 2020-04-02 NOTE — Telephone Encounter (Signed)
Reviewed notes from physician in [place].  Not very revealing and little detail.  Just said being treated for MCI.  No mention of dementia

## 2020-04-09 ENCOUNTER — Other Ambulatory Visit: Payer: Self-pay | Admitting: Gastroenterology

## 2020-04-14 ENCOUNTER — Telehealth: Payer: Self-pay

## 2020-04-14 ENCOUNTER — Other Ambulatory Visit: Payer: Self-pay

## 2020-04-14 ENCOUNTER — Ambulatory Visit (HOSPITAL_BASED_OUTPATIENT_CLINIC_OR_DEPARTMENT_OTHER)
Admission: RE | Admit: 2020-04-14 | Discharge: 2020-04-14 | Disposition: A | Discharge status: Home / Self Care | Payer: Medicare Other | Source: Ambulatory Visit | Attending: Neurology | Admitting: Neurology

## 2020-04-14 ENCOUNTER — Ambulatory Visit (HOSPITAL_COMMUNITY)
Admission: RE | Admit: 2020-04-14 | Discharge: 2020-04-14 | Disposition: A | Discharge status: Home / Self Care | Payer: Medicare Other | Source: Ambulatory Visit | Attending: Neurology | Admitting: Neurology

## 2020-04-14 DIAGNOSIS — I712 Thoracic aortic aneurysm, without rupture: Secondary | ICD-10-CM | POA: Insufficient documentation

## 2020-04-14 DIAGNOSIS — I351 Nonrheumatic aortic (valve) insufficiency: Secondary | ICD-10-CM | POA: Insufficient documentation

## 2020-04-14 DIAGNOSIS — G459 Transient cerebral ischemic attack, unspecified: Secondary | ICD-10-CM

## 2020-04-14 DIAGNOSIS — E785 Hyperlipidemia, unspecified: Secondary | ICD-10-CM | POA: Diagnosis not present

## 2020-04-14 DIAGNOSIS — G4730 Sleep apnea, unspecified: Secondary | ICD-10-CM | POA: Insufficient documentation

## 2020-04-14 DIAGNOSIS — K219 Gastro-esophageal reflux disease without esophagitis: Secondary | ICD-10-CM | POA: Diagnosis not present

## 2020-04-14 DIAGNOSIS — I10 Essential (primary) hypertension: Secondary | ICD-10-CM | POA: Diagnosis not present

## 2020-04-14 NOTE — Telephone Encounter (Signed)
Left VM with pt that her carotid ultrasound results are normal.

## 2020-04-14 NOTE — Progress Notes (Signed)
  Echocardiogram 2D Echocardiogram has been performed.  Angelica Guerrero 04/14/2020, 2:24 PM

## 2020-04-14 NOTE — Progress Notes (Signed)
Carotid artery duplex completed. Refer to "CV Proc" under chart review to view preliminary results.  04/14/2020 2:50 PM Kelby Aline., MHA, RVT, RDCS, RDMS

## 2020-04-14 NOTE — Telephone Encounter (Signed)
Received call from [facility]  In the echo lab at [facility] stating the patient was there for an Echo but wanted to have a carotid doppler study. Advised her that Dr Tat was in a room with patient and I would ask her as soon as she was available. She stated the patients husband is a physician and wanted the patient to ave the test. (patient did not want to leave without having both test done)  Spoke with Dr Tat and she was agreeable with patient having carotid doppler.   Order placed.  Melissa in the Echo lab notified and voiced understanding.

## 2020-04-15 ENCOUNTER — Telehealth: Payer: Self-pay | Admitting: Neurology

## 2020-04-15 ENCOUNTER — Telehealth: Payer: Self-pay | Admitting: Cardiovascular Disease

## 2020-04-15 DIAGNOSIS — I7121 Aneurysm of the ascending aorta, without rupture: Secondary | ICD-10-CM

## 2020-04-15 NOTE — Telephone Encounter (Signed)
Dr Oval Linsey had tried to call patient, wrong number.  I spoke with Dr Sharlett Iles and explained  aneurysm would likely just need to be watched and offered him appointment with cardiology to follow Scheduled appointment to see Dr Percival Spanish next 7/9

## 2020-04-15 NOTE — Telephone Encounter (Signed)
New message  Patient's husband (Dr. Sharlett Iles) is calling in to speak with a Doctor about his wife's bubble echo results. Please give Dr. Sharlett Iles a call back.

## 2020-04-15 NOTE — Telephone Encounter (Signed)
Contacted patient and tried to advise that patient that Dr Tat wants to refer patient to thoracic surgery for consult. The husband states " he is a physician and Dr Tat needs to give him a call so she can let him know what exactly hre test showed so he can determine what she should do."

## 2020-04-15 NOTE — Telephone Encounter (Signed)
Discussed with Dr Oval Linsey and she will speak with husband  Left message to call back

## 2020-04-15 NOTE — Telephone Encounter (Signed)
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  If agreeable, please refer to thoracic surgery for consult/monitoring.

## 2020-04-15 NOTE — Telephone Encounter (Signed)
I have given him/her the advice as below.  Aortic aneurysms are out of my field and I recommend consultation.

## 2020-04-15 NOTE — Telephone Encounter (Signed)
Spoke with patients spouse Dr Sharlett Iles. He states "its rude that Dr Tat did not call him back personally to give the results of the Echo report because he is a physician. He states he has already reached out to cardiology and handled the matter his self."  And hung up in my face. I was unable to try to explain anything or even advise spouse that this is out of Dr Doristine Devoid field which is why the patient is being referred.

## 2020-04-16 NOTE — Addendum Note (Signed)
Addended by: Ulice Brilliant T on: 04/16/2020 11:09 AM   Modules accepted: Orders

## 2020-04-23 ENCOUNTER — Encounter: Payer: Self-pay | Admitting: Cardiology

## 2020-04-23 DIAGNOSIS — I7121 Aneurysm of the ascending aorta, without rupture: Secondary | ICD-10-CM | POA: Insufficient documentation

## 2020-04-23 DIAGNOSIS — I351 Nonrheumatic aortic (valve) insufficiency: Secondary | ICD-10-CM | POA: Insufficient documentation

## 2020-04-23 DIAGNOSIS — Z7189 Other specified counseling: Secondary | ICD-10-CM | POA: Insufficient documentation

## 2020-04-23 NOTE — Progress Notes (Signed)
Cardiology Office Note   Date:  04/24/2020   ID:  Brystol, Wasilewski 1946/03/24, MRN [id number]  PCP:  Shon Baton, MD  Cardiologist:   Minus Breeding, MD Referring:  Shon Baton, MD   Chief Complaint  Patient presents with  . Abnormal echo      History of Present Illness: Angelica Guerrero is a 74 y.o. female who presents for evaluation of an abnormal echo.  She had a cryptogenic stroke and was seen by Dr. Lia Foyer and Dr. Rayann Heman in 2014.    She had an implanted recorder.  She has not had atrial fib.    She had questionable TIA-like symptoms recently.  As an evaluation she had echo with microcavitation done last last month demonstrated a normal EF.  There was mild asymmetric hypertrophy.  She had mild AI.  There is mild aortic dilatation of 38 mm.  She also has some mild aortic insufficiency.  I have reviewed this echo.  She actually has no other prior cardiac history other than rare palpitations and some atypical chest pain over the years.  She has some memory problems but no cardiac complaints.  She and her husband who is a retired Copywriter, advertising walk for exercise about 3-4 times per week although not as much recently.  She denies any cardiovascular symptoms with this. The patient denies any new symptoms such as chest discomfort, neck or arm discomfort. There has been no new shortness of breath, PND or orthopnea. There have been no reported palpitations, presyncope or syncope.     Past Medical History:  Diagnosis Date  . Depression   . External hemorrhoids 1969  . GERD (gastroesophageal reflux disease)   . Hyperlipemia   . Hypertension   . Memory loss   . Migraine, unspecified, without mention of intractable migraine without mention of status migrainosus    NONE IN YEARS  . Osteoarthrosis, unspecified whether generalized or localized, unspecified site   . Sleep apnea    noncompliant with cpap  . Snoring   . Status post placement of  implantable loop recorder    NO LONGER USING    Past Surgical History:  Procedure Laterality Date  . BLADDER SURGERY     Tacking with hysterectomy  . COLONOSCOPY  2018  . FACIAL COSMETIC SURGERY    . HAMMER TOE SURGERY Bilateral   . KNEE ARTHROSCOPY Left 08/07/2013   Procedure: LEFT ARTHROSCOPY KNEE WITH DEBRIDEMENT;  Surgeon: [name], MD;  Location: [facility];  Service: Orthopedics;  Laterality: Left;  . LOOP RECORDER IMPLANT  01-11-2013   Medtronic LinQ implanted by Dr Lovena Le for cryptogenic stroke  . LOOP RECORDER IMPLANT Left 01/11/2013   Procedure: LOOP RECORDER IMPLANT;  Surgeon: Evans Lance, MD;  Location: [facility] CATH LAB;  Service: Cardiovascular;  Laterality: Left;  . TONSILLECTOMY    . TOTAL ABDOMINAL HYSTERECTOMY     PARTIAL  . TOTAL KNEE ARTHROPLASTY  2009   right knee   . TOTAL KNEE ARTHROPLASTY Left 11/28/2016   Procedure: LEFT TOTAL KNEE ARTHROPLASTY;  Surgeon: Gaynelle Arabian, MD;  Location: [facility];  Service: Orthopedics;  Laterality: Left;  requests 41mins  . TUBAL LIGATION     bilateral     Current Outpatient Medications  Medication Sig Dispense Refill  . alosetron (LOTRONEX) 0.5 MG tablet TAKE ONE TABLET TWICE DAILY 60 tablet 0  . buPROPion (WELLBUTRIN XL) 150 MG 24 hr tablet Take 150 mg by mouth daily.    Marland Kitchen esomeprazole (NEXIUM) 40  MG capsule TAKE ONE CAPSULE TWICE DAILY BEFORE MEALS 60 capsule 2  . famotidine (PEPCID) 40 MG tablet TAKE ONE TABLET AT BEDTIME 30 tablet 0  . fexofenadine (ALLEGRA) 180 MG tablet Take 180 mg by mouth every morning.    Marland Kitchen FLUoxetine (PROZAC) 40 MG capsule Take 40 mg by mouth daily.     . hydrOXYzine (ATARAX/VISTARIL) 10 MG tablet Take 10 mg by mouth at bedtime.    Marland Kitchen lisinopril (PRINIVIL,ZESTRIL) 5 MG tablet Take 5 mg by mouth daily.    . Loperamide HCl (IMODIUM PO) Take 1 tablet by mouth daily.    . memantine (NAMENDA) 5 MG tablet Take 5 mg by mouth 2 (two) times daily.    . rosuvastatin (CRESTOR) 40 MG tablet Take 40 mg by mouth  daily.     No current facility-administered medications for this visit.    Allergies:   Lactose, Poison ivy extract, Lactose intolerance (gi), Latex, and Morphine and related    Social History:  The patient  reports that she quit smoking about 19 years ago. Her smoking use included cigarettes. She has a 10.00 pack-year smoking history. She has never used smokeless tobacco. She reports current alcohol use of about 14.0 standard drinks of alcohol per week. She reports that she does not use drugs.   Family History:  The patient's family history includes Allergies in her child and father; Alzheimer's disease in her maternal aunt and mother; Asthma in her father and grandchild; CAD (age of onset: 38) in her brother; Colon polyps in her paternal uncle; Dementia in her paternal grandmother; Diabetes in her father; Factor IX deficiency in her brother and brother; Hyperlipidemia in her mother; Rheum arthritis in her maternal grandmother.    ROS:  Please see the history of present illness.   Otherwise, review of systems are positive for memory disorder.   All other systems are reviewed and negative.    PHYSICAL EXAM: VS:  BP 139/82   Pulse 69   Temp 97.7 F (36.5 C)   Ht 5\' 6"  (1.676 m)   Wt 174 lb 12.8 oz (79.3 kg)   SpO2 97%   BMI 28.21 kg/m  , BMI Body mass index is 28.21 kg/m. GENERAL:  Well appearing HEENT:  Pupils equal round and reactive, fundi not visualized, oral mucosa unremarkable NECK:  No jugular venous distention, waveform within normal limits, carotid upstroke brisk and symmetric, possible slight bilateral bruits, no thyromegaly LYMPHATICS:  No cervical, inguinal adenopathy LUNGS:  Clear to auscultation bilaterally BACK:  No CVA tenderness CHEST:  Unremarkable HEART:  PMI not displaced or sustained,S1 and S2 within normal limits, no S3, no S4, no clicks, no rubs, no murmurs ABD:  Flat, positive bowel sounds normal in frequency in pitch, no bruits, no rebound, no guarding, no  midline pulsatile mass, no hepatomegaly, no splenomegaly EXT:  2 plus pulses throughout, no edema, no cyanosis no clubbing SKIN:  No rashes no nodules NEURO:  Cranial nerves II through XII grossly intact, motor grossly intact throughout PSYCH:  Cognitively intact, oriented to person place and time    EKG:  EKG is ordered today. The ekg ordered today demonstrates sinus rhythm, rate 68, axis within normal limits, intervals within normal limits, no acute ST changes.   Recent Labs: No results found for requested labs within last 8760 hours.    Lipid Panel No results found for: CHOL, TRIG, HDL, CHOLHDL, VLDL, LDLCALC, LDLDIRECT    Wt Readings from Last 3 Encounters:  04/24/20 174 lb  12.8 oz (79.3 kg)  03/23/20 175 lb (79.4 kg)  05/06/19 165 lb (74.8 kg)      Other studies Reviewed: Additional studies/ records that were reviewed today include: Echo. Review of the above records demonstrates:  Please see elsewhere in the note.     ASSESSMENT AND PLAN:  SLEEP APNEA:    She does have sleep apnea and does not wear CPAP so needs a new patient evaluation for this.  AI:  This is mild to moderate.  I do not hear this on exam.  I will follow this clinically.  ASCENDING AORTIC ANEURYSM:  This is mild and can be followed with a CT in one year.    SEPTAL HYPERTROPHY: This is mild.  I do not hear any murmur associated with this.  No further work-up.  I will follow this clinically and probably with a repeat echo in a couple of years.  This will also follow the aortic insufficiency.  TIA: She is being worked up for TIA.  I will order carotid Dopplers.  She might have slight carotid bruits.  COVID EDUCATION: She has had her vaccine.   Current medicines are reviewed at length with the patient today.  The patient does not have concerns regarding medicines.  The following changes have been made:  no change  Labs/ tests ordered today include:   Orders Placed This Encounter  Procedures  .  CT ANGIO CHEST AORTA W/CM & OR WO/CM  . Basic metabolic panel  . Ambulatory referral to Cardiology  . EKG 12-Lead  . VAS US CAROTID     Disposition:   FU with me in one year.     Signed, Minus Breeding, MD  04/24/2020 12:26 PM    [facility]

## 2020-04-24 ENCOUNTER — Other Ambulatory Visit: Payer: Self-pay

## 2020-04-24 ENCOUNTER — Encounter: Payer: Self-pay | Admitting: Cardiology

## 2020-04-24 ENCOUNTER — Ambulatory Visit (INDEPENDENT_AMBULATORY_CARE_PROVIDER_SITE_OTHER): Payer: Medicare Other | Admitting: Cardiology

## 2020-04-24 VITALS — BP 139/82 | HR 69 | Temp 97.7°F | Ht 66.0 in | Wt 174.8 lb

## 2020-04-24 DIAGNOSIS — I351 Nonrheumatic aortic (valve) insufficiency: Secondary | ICD-10-CM | POA: Diagnosis not present

## 2020-04-24 DIAGNOSIS — Z7189 Other specified counseling: Secondary | ICD-10-CM

## 2020-04-24 DIAGNOSIS — I712 Thoracic aortic aneurysm, without rupture, unspecified: Secondary | ICD-10-CM

## 2020-04-24 DIAGNOSIS — G459 Transient cerebral ischemic attack, unspecified: Secondary | ICD-10-CM | POA: Diagnosis not present

## 2020-04-24 DIAGNOSIS — I7121 Aneurysm of the ascending aorta, without rupture: Secondary | ICD-10-CM

## 2020-04-24 NOTE — Patient Instructions (Signed)
Medication Instructions:  No changes *If you need a refill on your cardiac medications before your next appointment, please call your pharmacy*   Lab Work: None ordered If you have labs (blood work) drawn today and your tests are completely normal, you will receive your results only by: Marland Kitchen MyChart Message (if you have MyChart) OR . A paper copy in the mail If you have any lab test that is abnormal or we need to change your treatment, we will call you to review the results.   Testing/Procedures: Your physician has requested that you have a carotid duplex. This test is an ultrasound of the carotid arteries in your neck. It looks at blood flow through these arteries that supply the brain with blood. Allow one hour for this exam. There are no restrictions or special instructions. This will take place at [place], [address].  Cardiac CT Angiography (CTA) in 12 months, is a special type of CT scan that uses a computer to produce multi-dimensional views of major blood vessels throughout the body. In CT angiography, a contrast material is injected through an IV to help visualize the blood vessels *You will need a BMET prior.    Follow-Up: At [facility], you and your health needs are our priority.  As part of our continuing mission to provide you with exceptional heart care, we have created designated Provider Care Teams.  These Care Teams include your primary Cardiologist (physician) and Advanced Practice Providers (APPs -  Physician Assistants and Nurse Practitioners) who all work together to provide you with the care you need, when you need it.  We recommend signing up for the patient portal called "MyChart".  Sign up information is provided on this After Visit Summary.  MyChart is used to connect with patients for Virtual Visits (Telemedicine).  Patients are able to view lab/test results, encounter notes, upcoming appointments, etc.  Non-urgent messages can be sent to your provider as  well.   To learn more about what you can do with MyChart, go to [email or web address].    Your next appointment:   12 month(s)  The format for your next appointment:   In Person  Provider:   You may see Dr. Percival Spanish or one of the following Advanced Practice Providers on your designated Care Team:    Rosaria Ferries, PA-C  Jory Sims, DNP, ANP  Cadence Kathlen Mody, PA-C    Other Instructions You have been referred to Dr. Claiborne Billings for the sleep clinic, as soon as possible

## 2020-04-29 ENCOUNTER — Telehealth: Payer: Self-pay

## 2020-04-29 ENCOUNTER — Telehealth: Payer: Self-pay | Admitting: Neurology

## 2020-04-29 ENCOUNTER — Other Ambulatory Visit: Payer: Self-pay

## 2020-04-29 ENCOUNTER — Ambulatory Visit
Admission: RE | Admit: 2020-04-29 | Discharge: 2020-04-29 | Disposition: A | Discharge status: Home / Self Care | Payer: Medicare Other | Source: Ambulatory Visit | Attending: Neurology | Admitting: Neurology

## 2020-04-29 DIAGNOSIS — R410 Disorientation, unspecified: Secondary | ICD-10-CM | POA: Diagnosis not present

## 2020-04-29 DIAGNOSIS — G9389 Other specified disorders of brain: Secondary | ICD-10-CM | POA: Diagnosis not present

## 2020-04-29 DIAGNOSIS — G459 Transient cerebral ischemic attack, unspecified: Secondary | ICD-10-CM

## 2020-04-29 DIAGNOSIS — R42 Dizziness and giddiness: Secondary | ICD-10-CM | POA: Diagnosis not present

## 2020-04-29 NOTE — Telephone Encounter (Signed)
[facility] called in to give MRI Brain results. Would like a call back at [phone] if we have results or not.

## 2020-04-29 NOTE — Telephone Encounter (Signed)
received call from [facility] stating she was calling the result in because the order was stat. Informed her that Dr Tat was not in the office but I would make her aware.  She voiced understanding.

## 2020-04-30 NOTE — Telephone Encounter (Signed)
Dr Tat has been notified.

## 2020-05-05 DIAGNOSIS — K589 Irritable bowel syndrome without diarrhea: Secondary | ICD-10-CM | POA: Diagnosis not present

## 2020-05-05 DIAGNOSIS — F900 Attention-deficit hyperactivity disorder, predominantly inattentive type: Secondary | ICD-10-CM | POA: Diagnosis not present

## 2020-05-05 DIAGNOSIS — I639 Cerebral infarction, unspecified: Secondary | ICD-10-CM | POA: Diagnosis not present

## 2020-05-05 DIAGNOSIS — F0390 Unspecified dementia without behavioral disturbance: Secondary | ICD-10-CM | POA: Diagnosis not present

## 2020-05-05 DIAGNOSIS — R413 Other amnesia: Secondary | ICD-10-CM | POA: Diagnosis not present

## 2020-05-08 ENCOUNTER — Telehealth: Payer: Self-pay | Admitting: Cardiology

## 2020-05-08 NOTE — Telephone Encounter (Signed)
Spoke with husband. He states he and patient will be out of town the last 2 week of Aug . Request another appointment. Husband reference to patient having a sleep study. Rn informed him Dr Percival Spanish set up referral for Dr Claiborne Billings to see patient prior to  Any studies being schedule. Per husband patient , had a sleep study - ( home study @ 10 years ago)patient did use a machine but not recently. Per husband patient has memory issues.  new appointment given to  Husband July 30 , 2021 at 4:40 pm.  Aware to come at 4:30 pm

## 2020-05-08 NOTE — Telephone Encounter (Signed)
New Message   Patients spouse is calling because he was under the impression that Dr. Percival Spanish was going to have the patient scheduled for a sleep study. Epic shows that the patient is to see Dr. Claiborne Billings for sleep but it does not show anything about the sleep study. The patient will probably have to cancel the 8/25 appt because per spouse they will be out of the country. He also states that the patient has already had the vascular study. Please review and contact the spouse. I did not cancel any appts until the chart was reviewed by nurse or MD.

## 2020-05-14 DIAGNOSIS — Z1231 Encounter for screening mammogram for malignant neoplasm of breast: Secondary | ICD-10-CM | POA: Diagnosis not present

## 2020-05-15 ENCOUNTER — Encounter: Payer: Self-pay | Admitting: Cardiovascular Disease

## 2020-05-15 ENCOUNTER — Ambulatory Visit (INDEPENDENT_AMBULATORY_CARE_PROVIDER_SITE_OTHER): Payer: Medicare Other | Admitting: Cardiovascular Disease

## 2020-05-15 ENCOUNTER — Other Ambulatory Visit: Payer: Self-pay

## 2020-05-15 DIAGNOSIS — I7121 Aneurysm of the ascending aorta, without rupture: Secondary | ICD-10-CM

## 2020-05-15 DIAGNOSIS — F32A Depression, unspecified: Secondary | ICD-10-CM

## 2020-05-15 DIAGNOSIS — I712 Thoracic aortic aneurysm, without rupture: Secondary | ICD-10-CM

## 2020-05-15 DIAGNOSIS — F419 Anxiety disorder, unspecified: Secondary | ICD-10-CM | POA: Diagnosis not present

## 2020-05-15 DIAGNOSIS — I10 Essential (primary) hypertension: Secondary | ICD-10-CM | POA: Diagnosis not present

## 2020-05-15 DIAGNOSIS — G4733 Obstructive sleep apnea (adult) (pediatric): Secondary | ICD-10-CM | POA: Diagnosis not present

## 2020-05-15 DIAGNOSIS — F329 Major depressive disorder, single episode, unspecified: Secondary | ICD-10-CM

## 2020-05-15 DIAGNOSIS — I77810 Thoracic aortic ectasia: Secondary | ICD-10-CM | POA: Diagnosis not present

## 2020-05-15 NOTE — Progress Notes (Signed)
Cardiology Office Note    Date:  05/22/2020   ID:  Shriya, Aker [birth year], MRN [id number]  PCP:  Shon Baton, MD  Cardiologist:  Shelva Majestic, MD (sleep); Dr. Percival Spanish  New sleep evaluation    History of Present Illness:  Angelica Guerrero is a 74 y.o. female who is the wife of Dr. Verl Blalock, he retired GI physician.  The patient was recently evaluated by Dr. Percival Spanish.  She has a history of a cryptogenic stroke remotely was seen by Dr. Lia Foyer in [place] and had an implantable recorder.  She has not had any atrial fibrillation.  He had recently seen her after she had had some questionable TIA symptoms.  An echo Doppler study was done which showed normal LV function EF of 55 to 60%, mild asymmetric left ventricular hypertrophy and grade 1 diastolic dysfunction.  There was mild to moderate aortic regurgitation.  Bubble study was negative without any evidence for interatrial shunting.  The patient has a history of obstructive sleep apnea and remotely had seen Dr. Danton Sewer.  She had initially seen him in June 2010.  She subsequently underwent an [facility]nea[facility] home sleep study on November 15, 2010 and her AHI was 15 events per hour.  She had significant oxygen desaturation to a nadir of 74 transiently and she spent 27 minutes of the night with saturations less than or equal to 88%.  Had used CPAP therapy for several years but has not used treatment in several years.  Presently, she has had some symptoms of witnessed gasping for breath and witnessed apneic spells by her husband, snoring, and admits to nocturia approximately 1-2 times per night.  She recently was evaluated for possible TIA and saw neurology.  She is scheduled to have carotid ultrasound.  When she was recently seen by Dr. Percival Spanish, it was advised that she undergo reevaluation of her untreated sleep apnea and she presents for initial evaluation with me.   Past Medical History:  Diagnosis Date  .  Depression   . External hemorrhoids 1969  . GERD (gastroesophageal reflux disease)   . Hyperlipemia   . Hypertension   . Memory loss   . Migraine, unspecified, without mention of intractable migraine without mention of status migrainosus    NONE IN YEARS  . Osteoarthrosis, unspecified whether generalized or localized, unspecified site   . Sleep apnea    noncompliant with cpap  . Snoring   . Status post placement of implantable loop recorder    NO LONGER USING    Past Surgical History:  Procedure Laterality Date  . BLADDER SURGERY     Tacking with hysterectomy  . COLONOSCOPY  2018  . FACIAL COSMETIC SURGERY    . HAMMER TOE SURGERY Bilateral   . KNEE ARTHROSCOPY Left 08/07/2013   Procedure: LEFT ARTHROSCOPY KNEE WITH DEBRIDEMENT;  Surgeon: [name], MD;  Location: [facility];  Service: Orthopedics;  Laterality: Left;  . LOOP RECORDER IMPLANT  01-11-2013   Medtronic LinQ implanted by Dr Lovena Le for cryptogenic stroke  . LOOP RECORDER IMPLANT Left 01/11/2013   Procedure: LOOP RECORDER IMPLANT;  Surgeon: Evans Lance, MD;  Location: [facility] C[facility];  Service: Cardiovascular;  Laterality: Left;  . TONSILLECTOMY    . TOTAL ABDOMINAL HYSTERECTOMY     PARTIAL  . TOTAL KNEE ARTHROPLASTY  2009   right knee   . TOTAL KNEE ARTHROPLASTY Left 11/28/2016   Procedure: LEFT TOTAL KNEE ARTHROPLASTY;  Surgeon: Gaynelle Arabian, MD;  Location: [facility];  Service: Orthopedics;  Laterality: Left;  requests 25mns  . TUBAL LIGATION     bilateral    Current Medications: Outpatient Medications Prior to Visit  Medication Sig Dispense Refill  . alosetron (LOTRONEX) 0.5 MG tablet TAKE ONE TABLET TWICE DAILY 60 tablet 0  . buPROPion (WELLBUTRIN XL) 150 MG 24 hr tablet Take 150 mg by mouth daily.    .Marland Kitchenesomeprazole (NEXIUM) 40 MG capsule TAKE ONE CAPSULE TWICE DAILY BEFORE MEALS 60 capsule 2  . famotidine (PEPCID) 40 MG tablet TAKE ONE TABLET AT BEDTIME 30 tablet 0  . fexofenadine (ALLEGRA) 180 MG tablet Take  180 mg by mouth every morning.    .Marland KitchenFLUoxetine (PROZAC) 40 MG capsule Take 40 mg by mouth daily.     . hydrOXYzine (ATARAX/VISTARIL) 10 MG tablet Take 10 mg by mouth at bedtime.    .Marland Kitchenlisinopril (PRINIVIL,ZESTRIL) 5 MG tablet Take 5 mg by mouth daily.    . Loperamide HCl (IMODIUM PO) Take 1 tablet by mouth daily.    . memantine (NAMENDA) 10 MG tablet memantine 10 mg tablet    . minocycline (MINOCIN) 100 MG capsule minocycline 100 mg capsule    . rosuvastatin (CRESTOR) 40 MG tablet Take 40 mg by mouth daily.    . memantine (NAMENDA) 5 MG tablet Take 5 mg by mouth 2 (two) times daily.     No facility-administered medications prior to visit.     Allergies:   Lactose, Poison ivy extract, Lactose intolerance (gi), Latex, and Morphine and related   Social History   Socioeconomic History  . Marital status: Married    Spouse name: Not on file  . Number of children: 3  . Years of education: Not on file  . Highest education level: Not on file  Occupational History  . Occupation: housewife    Employer: UNEMPLOYED    Comment: husband Dr. DVerl Blalock Tobacco Use  . Smoking status: Former Smoker    Packs/day: 1.00    Years: 10.00    Pack years: 10.00    Types: Cigarettes    Quit date: 10/17/2000    Years since quitting: 19.6  . Smokeless tobacco: Never Used  Vaping Use  . Vaping Use: Never used  Substance and Sexual Activity  . Alcohol use: Yes    Alcohol/week: 14.0 standard drinks    Types: 14 Glasses of wine per week    Comment: 2 glasses + wine/night  . Drug use: No  . Sexual activity: Not on file  Other Topics Concern  . Not on file  Social History Narrative   Pt is a housewife.   Pt lives with her husband Dr. PSharlett Iles  They have three grown children and 8 grandchildren.   Highest level of education:  4-years of college, B.S.   Social Determinants of Health   Financial Resource Strain:   . Difficulty of Paying Living Expenses:   Food Insecurity:   . Worried About  RCharity fundraiserin the Last Year:   . RArboriculturistin the Last Year:   Transportation Needs:   . LFilm/video editor(Medical):   .Marland KitchenLack of Transportation (Non-Medical):   Physical Activity:   . Days of Exercise per Week:   . Minutes of Exercise per Session:   Stress:   . Feeling of Stress :   Social Connections:   . Frequency of Communication with Friends and Family:   . Frequency of Social Gatherings with Friends and Family:   .  Attends Religious Services:   . Active Member of Clubs or Organizations:   . Attends Archivist Meetings:   Marland Kitchen Marital Status:      Family History:  The patient's family history includes Allergies in her child and father; Alzheimer's disease in her maternal aunt and mother; Asthma in her father and grandchild; CAD (age of onset: 35) in her brother; Colon polyps in her paternal uncle; Dementia in her paternal grandmother; Diabetes in her father; Factor IX deficiency in her brother and brother; Hyperlipidemia in her mother; Rheum arthritis in her maternal grandmother.   ROS General: Negative; No fevers, chills, or night sweats;  HEENT: Negative; No changes in vision or hearing, sinus congestion, difficulty swallowing Pulmonary: Negative; No cough, wheezing, shortness of breath, hemoptysis Cardiovascular: Negative; No chest pain, presyncope, syncope, palpitations GI: Negative; No nausea, vomiting, diarrhea, or abdominal pain GU: Negative; No dysuria, hematuria, or difficulty voiding Musculoskeletal: Negative; no myalgias, joint pain, or weakness Hematologic/Oncology: Negative; no easy bruising, bleeding Endocrine: Negative; no heat/cold intolerance; no diabetes Neuro: Negative; no changes in balance, headaches Skin: Negative; No rashes or skin lesions Psychiatric: Negative; No behavioral problems, depression Sleep: Positive for obstructive sleep apnea, moderate with an AHI of 15/h and significant oxygen desaturation to a nadir of 74% over  10 years ago.  She has not used CPAP in several years.  Positive for snoring, witnessed apnea, awakening gasping for breath, no significant daytime sleepiness. No bruxism, restless legs, hypnogognic hallucinations, no cataplexy Other comprehensive 14 point system review is negative.   PHYSICAL EXAM:   VS:  BP (!) 130/80   Pulse 83   Temp (!) 97.1 F (36.2 C)   Ht 5' 6"  (1.676 m)   Wt 178 lb (80.7 kg)   SpO2 94%   BMI 28.73 kg/m     Repeat blood pressure by me 128/80.  Wt Readings from Last 3 Encounters:  05/15/20 178 lb (80.7 kg)  04/24/20 174 lb 12.8 oz (79.3 kg)  03/23/20 175 lb (79.4 kg)    General: Alert, oriented, no distress.  Skin: normal turgor, no rashes, warm and dry HEENT: Normocephalic, atraumatic. Pupils equal round and reactive to light; sclera anicteric; extraocular muscles intact;  Nose without nasal septal hypertrophy Mouth/Parynx benign; Mallinpatti scale 3 Neck: No JVD, no carotid bruits; normal carotid upstroke Lungs: clear to ausculatation and percussion; no wheezing or rales Chest wall: without tenderness to palpitation Heart: PMI not displaced, RRR, s1 s2 normal, 1/6 systolic murmur, no diastolic murmur, no rubs, gallops, thrills, or heaves Abdomen: soft, nontender; no hepatosplenomehaly, BS+; abdominal aorta nontender and not dilated by palpation. Back: no CVA tenderness Pulses 2+ Musculoskeletal: full range of motion, normal strength, no joint deformities Extremities: no clubbing cyanosis or edema, Homan's sign negative  Neurologic: grossly nonfocal; Cranial nerves grossly wnl Psychologic: Normal mood and affect   Studies/Labs Reviewed:   EKG:  EKG is not ordered today.  I personally reviewed her last ECG from April 24, 2020 which shows normal sinus rhythm at 68 bpm.  No ectopy.  QTc interval is mildly prolonged at 487 ms.  Recent Labs: BMP Latest Ref Rng & Units 03/29/2019 11/30/2016 11/29/2016  Glucose 70 - 99 mg/dL 84 123(H) 117(H)  BUN 6 - 23  mg/dL 16 11 15   Creatinine 0.40 - 1.20 mg/dL 1.00 0.73 0.91  Sodium 135 - 145 mEq/L 141 134(L) 138  Potassium 3.5 - 5.1 mEq/L 4.0 3.7 4.1  Chloride 96 - 112 mEq/L 106 103 106  CO2 19 -  32 mEq/L 26 25 27   Calcium 8.4 - 10.5 mg/dL 9.2 8.6(L) 8.4(L)     Hepatic Function Latest Ref Rng & Units 11/23/2016 03/09/2016 01/17/2011  Total Protein 6.5 - 8.1 g/dL 7.7 6.6 6.7  Albumin 3.5 - 5.0 g/dL 4.3 4.1 3.9  AST 15 - 41 U/L 26 21 28   ALT 14 - 54 U/L 22 13 22   Alk Phosphatase 38 - 126 U/L 53 55 45  Total Bilirubin 0.3 - 1.2 mg/dL 0.8 0.4 0.5  Bilirubin, Direct 0.0 - 0.3 mg/dL - - 0.1    CBC Latest Ref Rng & Units 11/30/2016 11/29/2016 11/23/2016  WBC 4.0 - 10.5 K/uL 13.8(H) 12.5(H) 6.5  Hemoglobin 12.0 - 15.0 g/dL 9.0(L) 9.7(L) 14.1  Hematocrit 36 - 46 % 26.8(L) 29.5(L) 43.4  Platelets 150 - 400 K/uL 182 192 237   Lab Results  Component Value Date   MCV 96.4 11/30/2016   MCV 97.0 11/29/2016   MCV 98.6 11/23/2016   Lab Results  Component Value Date   TSH 1.41 03/09/2016   No results found for: HGBA1C   BNP No results found for: BNP  ProBNP No results found for: PROBNP   Lipid Panel  No results found for: CHOL, TRIG, HDL, CHOLHDL, VLDL, LDLCALC, LDLDIRECT, LABVLDL   RADIOLOGY: MR BRAIN WO CONTRAST  Result Date: 04/29/2020 CLINICAL DATA:  Confusion.  Dizziness.  Headache. EXAM: MRI HEAD WITHOUT CONTRAST TECHNIQUE: Multiplanar, multiecho pulse sequences of the brain and surrounding structures were obtained without intravenous contrast. COMPARISON:  Brain PET 05/04/2016 FINDINGS: Brain: Diffusion imaging does not show any acute or subacute infarction. There is generalized brain volume loss without discernible lobar predominance. The patient does not show any old or recent small or large vessel infarction. No mass lesion, hemorrhage, hydrocephalus or extra-axial collection. Vascular: Major vessels at the base of the brain show flow. Skull and upper cervical spine: Negative Sinuses/Orbits:  Clear/normal Other: None IMPRESSION: No acute or reversible finding. Generalized brain volume loss without discernible lobar predominance. No focal brain finding. Absence of chronic small-vessel changes. These results will be called to the ordering clinician or representative by the Radiologist Assistant, and communication documented in the PACS or [employer]. Electronically Signed   By: Nelson Chimes M.D.   On: 04/29/2020 13:56     Additional studies/ records that were reviewed today include:  I reviewed the patient's previous evaluations with Dr. Danton Sewer as well as her recent evaluation with Dr. Percival Spanish    ASSESSMENT:    1. OSA (obstructive sleep apnea)   2. Essential hypertension   3. Diastolic dysfunction grade 1   4. Mild dilation of ascending aorta (HCC)   5. Anxiety and depression     PLAN:  Ms. Angelica Guerrero is very pleasant 73 year old female who is well-known to me as a wife of Dr. Verl Blalock.  She has a history of mild hypertension and has been on lisinopril 5 mg daily.  She also has a history of anxiety/depression for which she has been on Wellbutrin XL 150 mg daily in addition to fluoxetine 40 mg.  Recently she also has had some memory issues and is on Namenda 10 mg.  She has a history of previously documented moderate obstructive sleep apnea on a home study in January 2012 reportedly was on CPAP therapy for several years.  She has not used CPAP over several years.  Recently, she has had instances of witnessed apnea, awakening gasping for breath, snoring, and she does not sleep on her  back.  She was recently evaluated for possible TIA symptoms.  Echo Doppler study has shown normal systolic function with mild to moderate AR, and she had a negative bubble study.  Spent considerable time with both she and her husband today.  I discussed normal sleep architecture and the adverse consequences of sleep apnea on the normal sleep pattern.  We also discussed cardiovascular  ramifications of untreated sleep apnea including effects on hypertension, nocturnal arrhythmias with potential increased risk for atrial fibrillation, potential for nocturnal or cerebrovascular ischemia is effects on gross metabolism, GERD, nocturia, and nonrestorative sleep with daytime sleepiness.  After much discussion, she wishes to pursue further evaluation and I will schedule her for an in lab split-night study, if she meets criteria, CPAP titration will be done the same night.  Her blood pressure today is controlled.  I repersonally reviewed her ECG from July 9 which shows normal sinus rhythm without ectopy.  QTC was mildly prolonged at 47 ms.  I will see her back in the office in 2 to 3 months for follow-up evaluation and further recommendations will be made at that time.   Medication Adjustments/Labs and Tests Ordered: Current medicines are reviewed at length with the patient today.  Concerns regarding medicines are outlined above.  Medication changes, Labs and Tests ordered today are listed in the Patient Instructions below. Patient Instructions  Medication Instructions:  CONTINUE WITH CURRENT MEDICATIONS. NO CHANGES.  *If you need a refill on your cardiac medications before your next appointment, please call your pharmacy*    Testing/Procedures: Your physician has recommended that you have a sleep study. This test records several body functions during sleep, including: brain activity, eye movement, oxygen and carbon dioxide blood levels, heart rate and rhythm, breathing rate and rhythm, the flow of air through your mouth and nose, snoring, body muscle movements, and chest and belly movement.     Follow-Up: At [facility], you and your health needs are our priority.  As part of our continuing mission to provide you with exceptional heart care, we have created designated Provider Care Teams.  These Care Teams include your primary Cardiologist (physician) and Advanced Practice  Providers (APPs -  Physician Assistants and Nurse Practitioners) who all work together to provide you with the care you need, when you need it.  We recommend signing up for the patient portal called "MyChart".  Sign up information is provided on this After Visit Summary.  MyChart is used to connect with patients for Virtual Visits (Telemedicine).  Patients are able to view lab/test results, encounter notes, upcoming appointments, etc.  Non-urgent messages can be sent to your provider as well.   To learn more about what you can do with MyChart, go to [email or web address].    Your next appointment:   3 month(s)  The format for your next appointment:   In Person  Provider:   Shelva Majestic, MD       Signed, Shelva Majestic, MD  05/22/2020 3:32 PM    [facility] [address], [place], [place], [place]  [address] Phone: [phone]

## 2020-05-15 NOTE — Patient Instructions (Signed)
Medication Instructions:  CONTINUE WITH CURRENT MEDICATIONS. NO CHANGES.  *If you need a refill on your cardiac medications before your next appointment, please call your pharmacy*   Testing/Procedures: Your physician has recommended that you have a sleep study. This test records several body functions during sleep, including: brain activity, eye movement, oxygen and carbon dioxide blood levels, heart rate and rhythm, breathing rate and rhythm, the flow of air through your mouth and nose, snoring, body muscle movements, and chest and belly movement.     Follow-Up: At [facility], you and your health needs are our priority.  As part of our continuing mission to provide you with exceptional heart care, we have created designated Provider Care Teams.  These Care Teams include your primary Cardiologist (physician) and Advanced Practice Providers (APPs -  Physician Assistants and Nurse Practitioners) who all work together to provide you with the care you need, when you need it.  We recommend signing up for the patient portal called "MyChart".  Sign up information is provided on this After Visit Summary.  MyChart is used to connect with patients for Virtual Visits (Telemedicine).  Patients are able to view lab/test results, encounter notes, upcoming appointments, etc.  Non-urgent messages can be sent to your provider as well.   To learn more about what you can do with MyChart, go to [email or web address].    Your next appointment:   3 month(s)  The format for your next appointment:   In Person  Provider:   Thomas Kelly, MD    

## 2020-05-19 ENCOUNTER — Telehealth: Payer: Self-pay | Admitting: *Deleted

## 2020-05-19 ENCOUNTER — Ambulatory Visit (HOSPITAL_COMMUNITY): Admission: RE | Admit: 2020-05-19 | Payer: Medicare Other | Source: Ambulatory Visit

## 2020-05-19 NOTE — Telephone Encounter (Signed)
Patient's husband notified of sleep study appointment details.

## 2020-06-01 ENCOUNTER — Encounter: Payer: Medicare Other | Admitting: Cardiothoracic Surgery

## 2020-06-01 DIAGNOSIS — Z20822 Contact with and (suspected) exposure to covid-19: Secondary | ICD-10-CM | POA: Diagnosis not present

## 2020-06-10 ENCOUNTER — Ambulatory Visit: Payer: Medicare Other | Admitting: Cardiovascular Disease

## 2020-06-17 ENCOUNTER — Encounter (HOSPITAL_BASED_OUTPATIENT_CLINIC_OR_DEPARTMENT_OTHER): Payer: Medicare Other | Admitting: Cardiovascular Disease

## 2020-06-23 ENCOUNTER — Other Ambulatory Visit: Payer: Self-pay | Admitting: Gastroenterology

## 2020-08-04 DIAGNOSIS — Z01419 Encounter for gynecological examination (general) (routine) without abnormal findings: Secondary | ICD-10-CM | POA: Diagnosis not present

## 2020-08-04 DIAGNOSIS — Z6829 Body mass index (BMI) 29.0-29.9, adult: Secondary | ICD-10-CM | POA: Diagnosis not present

## 2020-08-05 DIAGNOSIS — R413 Other amnesia: Secondary | ICD-10-CM | POA: Diagnosis not present

## 2020-08-05 DIAGNOSIS — I639 Cerebral infarction, unspecified: Secondary | ICD-10-CM | POA: Diagnosis not present

## 2020-08-05 DIAGNOSIS — F0390 Unspecified dementia without behavioral disturbance: Secondary | ICD-10-CM | POA: Diagnosis not present

## 2020-08-05 DIAGNOSIS — F900 Attention-deficit hyperactivity disorder, predominantly inattentive type: Secondary | ICD-10-CM | POA: Diagnosis not present

## 2020-08-05 DIAGNOSIS — K589 Irritable bowel syndrome without diarrhea: Secondary | ICD-10-CM | POA: Diagnosis not present

## 2020-08-08 DIAGNOSIS — Z23 Encounter for immunization: Secondary | ICD-10-CM | POA: Diagnosis not present

## 2020-08-17 ENCOUNTER — Telehealth: Payer: Self-pay | Admitting: *Deleted

## 2020-08-17 NOTE — Telephone Encounter (Signed)
HUSBAND/Patient is scheduled for lab study on 09/22/20. Patient understands her sleep study will be done at [facility] sleep lab. Patient understands she will receive a sleep packet in a week or so. Patient understands to call if she does not receive the sleep packet in a timely manner. Patient's husband understands sleep study appointment details.

## 2020-08-20 ENCOUNTER — Ambulatory Visit: Payer: Medicare Other | Admitting: Cardiovascular Disease

## 2020-09-09 DIAGNOSIS — R7301 Impaired fasting glucose: Secondary | ICD-10-CM | POA: Diagnosis not present

## 2020-09-09 DIAGNOSIS — E785 Hyperlipidemia, unspecified: Secondary | ICD-10-CM | POA: Diagnosis not present

## 2020-09-17 DIAGNOSIS — F0390 Unspecified dementia without behavioral disturbance: Secondary | ICD-10-CM | POA: Diagnosis not present

## 2020-09-17 DIAGNOSIS — R7301 Impaired fasting glucose: Secondary | ICD-10-CM | POA: Diagnosis not present

## 2020-09-17 DIAGNOSIS — M1990 Unspecified osteoarthritis, unspecified site: Secondary | ICD-10-CM | POA: Diagnosis not present

## 2020-09-17 DIAGNOSIS — Z8673 Personal history of transient ischemic attack (TIA), and cerebral infarction without residual deficits: Secondary | ICD-10-CM | POA: Diagnosis not present

## 2020-09-17 DIAGNOSIS — R197 Diarrhea, unspecified: Secondary | ICD-10-CM | POA: Diagnosis not present

## 2020-09-17 DIAGNOSIS — I712 Thoracic aortic aneurysm, without rupture: Secondary | ICD-10-CM | POA: Diagnosis not present

## 2020-09-17 DIAGNOSIS — I700 Atherosclerosis of aorta: Secondary | ICD-10-CM | POA: Diagnosis not present

## 2020-09-17 DIAGNOSIS — E739 Lactose intolerance, unspecified: Secondary | ICD-10-CM | POA: Diagnosis not present

## 2020-09-17 DIAGNOSIS — I10 Essential (primary) hypertension: Secondary | ICD-10-CM | POA: Diagnosis not present

## 2020-09-17 DIAGNOSIS — E785 Hyperlipidemia, unspecified: Secondary | ICD-10-CM | POA: Diagnosis not present

## 2020-09-17 DIAGNOSIS — F419 Anxiety disorder, unspecified: Secondary | ICD-10-CM | POA: Diagnosis not present

## 2020-09-17 DIAGNOSIS — Z0000 Encounter for general adult medical examination without abnormal findings: Secondary | ICD-10-CM | POA: Diagnosis not present

## 2020-09-22 ENCOUNTER — Encounter (HOSPITAL_BASED_OUTPATIENT_CLINIC_OR_DEPARTMENT_OTHER): Payer: Medicare Other | Admitting: Cardiovascular Disease

## 2020-10-22 ENCOUNTER — Other Ambulatory Visit: Payer: Self-pay | Admitting: Gastroenterology

## 2020-11-25 ENCOUNTER — Other Ambulatory Visit: Payer: Self-pay | Admitting: Gastroenterology

## 2020-11-25 NOTE — Telephone Encounter (Signed)
Patient was last seen in 03/2019. Ok to refill Lotronex and famotidine?

## 2021-01-06 DIAGNOSIS — Z1231 Encounter for screening mammogram for malignant neoplasm of breast: Secondary | ICD-10-CM | POA: Diagnosis not present

## 2021-01-09 ENCOUNTER — Other Ambulatory Visit: Payer: Self-pay | Admitting: Gastroenterology

## 2021-02-02 ENCOUNTER — Other Ambulatory Visit: Payer: Self-pay | Admitting: Gastroenterology

## 2021-03-20 ENCOUNTER — Other Ambulatory Visit: Payer: Self-pay | Admitting: Gastroenterology

## 2021-03-23 DIAGNOSIS — R7301 Impaired fasting glucose: Secondary | ICD-10-CM | POA: Diagnosis not present

## 2021-03-23 DIAGNOSIS — M1990 Unspecified osteoarthritis, unspecified site: Secondary | ICD-10-CM | POA: Diagnosis not present

## 2021-03-23 DIAGNOSIS — I10 Essential (primary) hypertension: Secondary | ICD-10-CM | POA: Diagnosis not present

## 2021-03-23 DIAGNOSIS — G4733 Obstructive sleep apnea (adult) (pediatric): Secondary | ICD-10-CM | POA: Diagnosis not present

## 2021-03-23 DIAGNOSIS — I700 Atherosclerosis of aorta: Secondary | ICD-10-CM | POA: Diagnosis not present

## 2021-03-23 DIAGNOSIS — F419 Anxiety disorder, unspecified: Secondary | ICD-10-CM | POA: Diagnosis not present

## 2021-03-23 DIAGNOSIS — K449 Diaphragmatic hernia without obstruction or gangrene: Secondary | ICD-10-CM | POA: Diagnosis not present

## 2021-03-23 DIAGNOSIS — Z8673 Personal history of transient ischemic attack (TIA), and cerebral infarction without residual deficits: Secondary | ICD-10-CM | POA: Diagnosis not present

## 2021-03-23 DIAGNOSIS — R32 Unspecified urinary incontinence: Secondary | ICD-10-CM | POA: Diagnosis not present

## 2021-03-23 DIAGNOSIS — F0390 Unspecified dementia without behavioral disturbance: Secondary | ICD-10-CM | POA: Diagnosis not present

## 2021-03-23 DIAGNOSIS — E785 Hyperlipidemia, unspecified: Secondary | ICD-10-CM | POA: Diagnosis not present

## 2021-03-23 DIAGNOSIS — I712 Thoracic aortic aneurysm, without rupture: Secondary | ICD-10-CM | POA: Diagnosis not present

## 2021-04-22 ENCOUNTER — Other Ambulatory Visit: Payer: Self-pay | Admitting: *Deleted

## 2021-04-22 DIAGNOSIS — I712 Thoracic aortic aneurysm, without rupture, unspecified: Secondary | ICD-10-CM

## 2021-04-22 NOTE — Progress Notes (Signed)
cta

## 2021-04-25 DIAGNOSIS — Z20822 Contact with and (suspected) exposure to covid-19: Secondary | ICD-10-CM | POA: Diagnosis not present

## 2021-05-17 ENCOUNTER — Other Ambulatory Visit: Payer: Self-pay | Admitting: Gastroenterology

## 2021-07-13 ENCOUNTER — Other Ambulatory Visit: Payer: Self-pay | Admitting: Gastroenterology

## 2021-07-26 ENCOUNTER — Other Ambulatory Visit: Payer: Self-pay

## 2021-07-26 DIAGNOSIS — I712 Thoracic aortic aneurysm, without rupture, unspecified: Secondary | ICD-10-CM

## 2021-08-17 DIAGNOSIS — Z23 Encounter for immunization: Secondary | ICD-10-CM | POA: Diagnosis not present

## 2021-08-18 ENCOUNTER — Other Ambulatory Visit: Payer: Self-pay

## 2021-08-18 ENCOUNTER — Ambulatory Visit
Admission: RE | Admit: 2021-08-18 | Discharge: 2021-08-18 | Disposition: A | Discharge status: Home / Self Care | Payer: Medicare Other | Source: Ambulatory Visit | Attending: Cardiology | Admitting: Cardiology

## 2021-08-18 DIAGNOSIS — I712 Thoracic aortic aneurysm, without rupture, unspecified: Secondary | ICD-10-CM

## 2021-08-18 MED ORDER — IOPAMIDOL (ISOVUE-370) INJECTION 76%
75.0000 mL | Freq: Once | INTRAVENOUS | Status: AC | PRN
  Administered 2021-08-18: 75 mL via INTRAVENOUS

## 2021-08-23 ENCOUNTER — Encounter: Payer: Self-pay | Admitting: *Deleted

## 2021-08-31 DIAGNOSIS — Z6827 Body mass index (BMI) 27.0-27.9, adult: Secondary | ICD-10-CM | POA: Diagnosis not present

## 2021-08-31 DIAGNOSIS — Z01419 Encounter for gynecological examination (general) (routine) without abnormal findings: Secondary | ICD-10-CM | POA: Diagnosis not present

## 2021-09-15 ENCOUNTER — Other Ambulatory Visit: Payer: Self-pay | Admitting: Gastroenterology

## 2021-09-23 DIAGNOSIS — Z20822 Contact with and (suspected) exposure to covid-19: Secondary | ICD-10-CM | POA: Diagnosis not present

## 2021-09-30 DIAGNOSIS — F0390 Unspecified dementia without behavioral disturbance: Secondary | ICD-10-CM | POA: Diagnosis not present

## 2021-09-30 DIAGNOSIS — R32 Unspecified urinary incontinence: Secondary | ICD-10-CM | POA: Diagnosis not present

## 2021-09-30 DIAGNOSIS — E785 Hyperlipidemia, unspecified: Secondary | ICD-10-CM | POA: Diagnosis not present

## 2021-09-30 DIAGNOSIS — I700 Atherosclerosis of aorta: Secondary | ICD-10-CM | POA: Diagnosis not present

## 2021-09-30 DIAGNOSIS — I712 Thoracic aortic aneurysm, without rupture, unspecified: Secondary | ICD-10-CM | POA: Diagnosis not present

## 2021-09-30 DIAGNOSIS — R7301 Impaired fasting glucose: Secondary | ICD-10-CM | POA: Diagnosis not present

## 2021-09-30 DIAGNOSIS — M1990 Unspecified osteoarthritis, unspecified site: Secondary | ICD-10-CM | POA: Diagnosis not present

## 2021-09-30 DIAGNOSIS — F329 Major depressive disorder, single episode, unspecified: Secondary | ICD-10-CM | POA: Diagnosis not present

## 2021-09-30 DIAGNOSIS — E739 Lactose intolerance, unspecified: Secondary | ICD-10-CM | POA: Diagnosis not present

## 2021-09-30 DIAGNOSIS — G4700 Insomnia, unspecified: Secondary | ICD-10-CM | POA: Diagnosis not present

## 2021-09-30 DIAGNOSIS — I10 Essential (primary) hypertension: Secondary | ICD-10-CM | POA: Diagnosis not present

## 2021-09-30 DIAGNOSIS — Z0000 Encounter for general adult medical examination without abnormal findings: Secondary | ICD-10-CM | POA: Diagnosis not present

## 2021-10-06 DIAGNOSIS — Z1159 Encounter for screening for other viral diseases: Secondary | ICD-10-CM | POA: Diagnosis not present

## 2021-10-06 DIAGNOSIS — Z20828 Contact with and (suspected) exposure to other viral communicable diseases: Secondary | ICD-10-CM | POA: Diagnosis not present

## 2021-10-18 DIAGNOSIS — Z20828 Contact with and (suspected) exposure to other viral communicable diseases: Secondary | ICD-10-CM | POA: Diagnosis not present

## 2021-11-22 ENCOUNTER — Other Ambulatory Visit: Payer: Self-pay | Admitting: Gastroenterology

## 2021-12-13 DIAGNOSIS — Z20822 Contact with and (suspected) exposure to covid-19: Secondary | ICD-10-CM | POA: Diagnosis not present

## 2022-01-18 DIAGNOSIS — Z20822 Contact with and (suspected) exposure to covid-19: Secondary | ICD-10-CM | POA: Diagnosis not present

## 2022-01-20 ENCOUNTER — Telehealth: Payer: Self-pay

## 2022-01-20 DIAGNOSIS — Z20822 Contact with and (suspected) exposure to covid-19: Secondary | ICD-10-CM | POA: Diagnosis not present

## 2022-01-20 NOTE — Telephone Encounter (Signed)
I called and spoke with [facility] they stated patient had not met deductible which is the reason it was so expensive. I called Dr. Sharlett Iles & made him aware. I also advised him that he could go to [facility] and use a good rx coupon to get it much cheaper, however he preferred to stick with this pharmacy. No further questions. ?

## 2022-01-20 NOTE — Telephone Encounter (Signed)
Patient's husband calling about her lotronex.  Price at pharmacy today >$700.  He is requesting that we contact [place] and assist with the pricing .  ?

## 2022-01-24 ENCOUNTER — Other Ambulatory Visit: Payer: Self-pay

## 2022-01-24 MED ORDER — ESOMEPRAZOLE MAGNESIUM 40 MG PO CPDR: DELAYED_RELEASE_CAPSULE | ORAL | 0 refills | Status: DC

## 2022-01-31 DIAGNOSIS — Z20822 Contact with and (suspected) exposure to covid-19: Secondary | ICD-10-CM | POA: Diagnosis not present

## 2022-02-18 DIAGNOSIS — F329 Major depressive disorder, single episode, unspecified: Secondary | ICD-10-CM | POA: Diagnosis not present

## 2022-02-18 DIAGNOSIS — F419 Anxiety disorder, unspecified: Secondary | ICD-10-CM | POA: Diagnosis not present

## 2022-02-18 DIAGNOSIS — R7301 Impaired fasting glucose: Secondary | ICD-10-CM | POA: Diagnosis not present

## 2022-02-18 DIAGNOSIS — F0390 Unspecified dementia without behavioral disturbance: Secondary | ICD-10-CM | POA: Diagnosis not present

## 2022-02-18 DIAGNOSIS — I7121 Aneurysm of the ascending aorta, without rupture: Secondary | ICD-10-CM | POA: Diagnosis not present

## 2022-02-18 DIAGNOSIS — Z8673 Personal history of transient ischemic attack (TIA), and cerebral infarction without residual deficits: Secondary | ICD-10-CM | POA: Diagnosis not present

## 2022-02-18 DIAGNOSIS — I10 Essential (primary) hypertension: Secondary | ICD-10-CM | POA: Diagnosis not present

## 2022-02-18 DIAGNOSIS — I700 Atherosclerosis of aorta: Secondary | ICD-10-CM | POA: Diagnosis not present

## 2022-02-19 DIAGNOSIS — Z20822 Contact with and (suspected) exposure to covid-19: Secondary | ICD-10-CM | POA: Diagnosis not present

## 2022-02-21 DIAGNOSIS — Z20822 Contact with and (suspected) exposure to covid-19: Secondary | ICD-10-CM | POA: Diagnosis not present

## 2022-04-08 ENCOUNTER — Other Ambulatory Visit: Payer: Self-pay | Admitting: Gastroenterology

## 2022-05-10 ENCOUNTER — Other Ambulatory Visit: Payer: Self-pay | Admitting: Gastroenterology

## 2022-05-12 ENCOUNTER — Other Ambulatory Visit (HOSPITAL_COMMUNITY): Payer: Self-pay

## 2022-05-12 ENCOUNTER — Telehealth: Payer: Self-pay

## 2022-05-12 NOTE — Telephone Encounter (Signed)
Patient Advocate Encounter  Prior Authorization for Alosetron 0.'5MG'$  has been approved.   Effective: 10/17/2021 to 05/12/2023  Clista Bernhardt, CPhT Rx Patient Advocate Specialist Phone: [phone]

## 2022-05-12 NOTE — Telephone Encounter (Signed)
Patient Advocate Encounter   Received notification from Pharmacy that prior authorization is required for Alosetron 0.'5MG'$ .  Submitted: 05/12/2022 Key [id number] Status is pending  Clista Bernhardt, CPhT Rx Patient Advocate Specialist Phone: [phone]

## 2022-06-28 ENCOUNTER — Other Ambulatory Visit: Payer: Self-pay | Admitting: Gastroenterology

## 2022-08-05 ENCOUNTER — Other Ambulatory Visit: Payer: Self-pay | Admitting: *Deleted

## 2022-08-05 DIAGNOSIS — I712 Thoracic aortic aneurysm, without rupture, unspecified: Secondary | ICD-10-CM

## 2022-09-21 DIAGNOSIS — B351 Tinea unguium: Secondary | ICD-10-CM | POA: Diagnosis not present

## 2022-09-21 DIAGNOSIS — M2011 Hallux valgus (acquired), right foot: Secondary | ICD-10-CM | POA: Diagnosis not present

## 2022-12-22 DIAGNOSIS — M2011 Hallux valgus (acquired), right foot: Secondary | ICD-10-CM | POA: Diagnosis not present

## 2022-12-22 DIAGNOSIS — B351 Tinea unguium: Secondary | ICD-10-CM | POA: Diagnosis not present

## 2023-02-27 DIAGNOSIS — R32 Unspecified urinary incontinence: Secondary | ICD-10-CM | POA: Diagnosis not present

## 2023-02-27 DIAGNOSIS — I7121 Aneurysm of the ascending aorta, without rupture: Secondary | ICD-10-CM | POA: Diagnosis not present

## 2023-02-27 DIAGNOSIS — R197 Diarrhea, unspecified: Secondary | ICD-10-CM | POA: Diagnosis not present

## 2023-02-27 DIAGNOSIS — I700 Atherosclerosis of aorta: Secondary | ICD-10-CM | POA: Diagnosis not present

## 2023-02-27 DIAGNOSIS — I10 Essential (primary) hypertension: Secondary | ICD-10-CM | POA: Diagnosis not present

## 2023-02-27 DIAGNOSIS — F0390 Unspecified dementia without behavioral disturbance: Secondary | ICD-10-CM | POA: Diagnosis not present

## 2023-02-27 DIAGNOSIS — K219 Gastro-esophageal reflux disease without esophagitis: Secondary | ICD-10-CM | POA: Diagnosis not present

## 2023-02-27 DIAGNOSIS — M1990 Unspecified osteoarthritis, unspecified site: Secondary | ICD-10-CM | POA: Diagnosis not present

## 2023-02-27 DIAGNOSIS — R7301 Impaired fasting glucose: Secondary | ICD-10-CM | POA: Diagnosis not present

## 2023-02-27 DIAGNOSIS — Z0000 Encounter for general adult medical examination without abnormal findings: Secondary | ICD-10-CM | POA: Diagnosis not present

## 2023-02-27 DIAGNOSIS — E785 Hyperlipidemia, unspecified: Secondary | ICD-10-CM | POA: Diagnosis not present

## 2023-02-27 DIAGNOSIS — Z8673 Personal history of transient ischemic attack (TIA), and cerebral infarction without residual deficits: Secondary | ICD-10-CM | POA: Diagnosis not present

## 2023-03-23 DIAGNOSIS — B351 Tinea unguium: Secondary | ICD-10-CM | POA: Diagnosis not present

## 2023-03-23 DIAGNOSIS — M2011 Hallux valgus (acquired), right foot: Secondary | ICD-10-CM | POA: Diagnosis not present

## 2023-06-12 DIAGNOSIS — G309 Alzheimer's disease, unspecified: Secondary | ICD-10-CM | POA: Diagnosis not present

## 2023-06-12 DIAGNOSIS — I10 Essential (primary) hypertension: Secondary | ICD-10-CM | POA: Diagnosis not present

## 2023-06-12 DIAGNOSIS — E782 Mixed hyperlipidemia: Secondary | ICD-10-CM | POA: Diagnosis not present

## 2023-06-12 DIAGNOSIS — K589 Irritable bowel syndrome without diarrhea: Secondary | ICD-10-CM | POA: Diagnosis not present

## 2023-06-12 DIAGNOSIS — K219 Gastro-esophageal reflux disease without esophagitis: Secondary | ICD-10-CM | POA: Diagnosis not present

## 2023-06-12 DIAGNOSIS — F02B0 Dementia in other diseases classified elsewhere, moderate, without behavioral disturbance, psychotic disturbance, mood disturbance, and anxiety: Secondary | ICD-10-CM | POA: Diagnosis not present

## 2023-06-12 DIAGNOSIS — G4733 Obstructive sleep apnea (adult) (pediatric): Secondary | ICD-10-CM | POA: Diagnosis not present

## 2023-07-07 DIAGNOSIS — I10 Essential (primary) hypertension: Secondary | ICD-10-CM | POA: Diagnosis not present

## 2023-07-07 DIAGNOSIS — R41841 Cognitive communication deficit: Secondary | ICD-10-CM | POA: Diagnosis not present

## 2023-07-07 DIAGNOSIS — F0390 Unspecified dementia without behavioral disturbance: Secondary | ICD-10-CM | POA: Diagnosis not present

## 2023-07-10 DIAGNOSIS — R41841 Cognitive communication deficit: Secondary | ICD-10-CM | POA: Diagnosis not present

## 2023-07-10 DIAGNOSIS — I10 Essential (primary) hypertension: Secondary | ICD-10-CM | POA: Diagnosis not present

## 2023-07-10 DIAGNOSIS — F0390 Unspecified dementia without behavioral disturbance: Secondary | ICD-10-CM | POA: Diagnosis not present

## 2023-07-11 DIAGNOSIS — R41841 Cognitive communication deficit: Secondary | ICD-10-CM | POA: Diagnosis not present

## 2023-07-11 DIAGNOSIS — F0390 Unspecified dementia without behavioral disturbance: Secondary | ICD-10-CM | POA: Diagnosis not present

## 2023-07-11 DIAGNOSIS — I10 Essential (primary) hypertension: Secondary | ICD-10-CM | POA: Diagnosis not present

## 2023-07-12 DIAGNOSIS — F02B0 Dementia in other diseases classified elsewhere, moderate, without behavioral disturbance, psychotic disturbance, mood disturbance, and anxiety: Secondary | ICD-10-CM | POA: Diagnosis not present

## 2023-07-12 DIAGNOSIS — K219 Gastro-esophageal reflux disease without esophagitis: Secondary | ICD-10-CM | POA: Diagnosis not present

## 2023-07-12 DIAGNOSIS — I10 Essential (primary) hypertension: Secondary | ICD-10-CM | POA: Diagnosis not present

## 2023-07-12 DIAGNOSIS — G301 Alzheimer's disease with late onset: Secondary | ICD-10-CM | POA: Diagnosis not present

## 2023-07-12 DIAGNOSIS — E7849 Other hyperlipidemia: Secondary | ICD-10-CM | POA: Diagnosis not present

## 2023-07-13 DIAGNOSIS — I10 Essential (primary) hypertension: Secondary | ICD-10-CM | POA: Diagnosis not present

## 2023-07-13 DIAGNOSIS — F0390 Unspecified dementia without behavioral disturbance: Secondary | ICD-10-CM | POA: Diagnosis not present

## 2023-07-13 DIAGNOSIS — R41841 Cognitive communication deficit: Secondary | ICD-10-CM | POA: Diagnosis not present

## 2023-07-14 DIAGNOSIS — F0390 Unspecified dementia without behavioral disturbance: Secondary | ICD-10-CM | POA: Diagnosis not present

## 2023-07-14 DIAGNOSIS — I10 Essential (primary) hypertension: Secondary | ICD-10-CM | POA: Diagnosis not present

## 2023-07-14 DIAGNOSIS — R41841 Cognitive communication deficit: Secondary | ICD-10-CM | POA: Diagnosis not present

## 2023-07-17 DIAGNOSIS — F0390 Unspecified dementia without behavioral disturbance: Secondary | ICD-10-CM | POA: Diagnosis not present

## 2023-07-17 DIAGNOSIS — R41841 Cognitive communication deficit: Secondary | ICD-10-CM | POA: Diagnosis not present

## 2023-07-17 DIAGNOSIS — I10 Essential (primary) hypertension: Secondary | ICD-10-CM | POA: Diagnosis not present

## 2023-07-19 DIAGNOSIS — I10 Essential (primary) hypertension: Secondary | ICD-10-CM | POA: Diagnosis not present

## 2023-07-19 DIAGNOSIS — R41841 Cognitive communication deficit: Secondary | ICD-10-CM | POA: Diagnosis not present

## 2023-07-19 DIAGNOSIS — F0390 Unspecified dementia without behavioral disturbance: Secondary | ICD-10-CM | POA: Diagnosis not present

## 2023-07-20 DIAGNOSIS — R41841 Cognitive communication deficit: Secondary | ICD-10-CM | POA: Diagnosis not present

## 2023-07-20 DIAGNOSIS — I10 Essential (primary) hypertension: Secondary | ICD-10-CM | POA: Diagnosis not present

## 2023-07-20 DIAGNOSIS — F0390 Unspecified dementia without behavioral disturbance: Secondary | ICD-10-CM | POA: Diagnosis not present

## 2023-07-21 DIAGNOSIS — F0390 Unspecified dementia without behavioral disturbance: Secondary | ICD-10-CM | POA: Diagnosis not present

## 2023-07-21 DIAGNOSIS — R41841 Cognitive communication deficit: Secondary | ICD-10-CM | POA: Diagnosis not present

## 2023-07-21 DIAGNOSIS — I10 Essential (primary) hypertension: Secondary | ICD-10-CM | POA: Diagnosis not present

## 2023-07-24 DIAGNOSIS — I10 Essential (primary) hypertension: Secondary | ICD-10-CM | POA: Diagnosis not present

## 2023-07-24 DIAGNOSIS — F0390 Unspecified dementia without behavioral disturbance: Secondary | ICD-10-CM | POA: Diagnosis not present

## 2023-07-24 DIAGNOSIS — R41841 Cognitive communication deficit: Secondary | ICD-10-CM | POA: Diagnosis not present

## 2023-07-27 DIAGNOSIS — F0390 Unspecified dementia without behavioral disturbance: Secondary | ICD-10-CM | POA: Diagnosis not present

## 2023-07-27 DIAGNOSIS — I10 Essential (primary) hypertension: Secondary | ICD-10-CM | POA: Diagnosis not present

## 2023-07-27 DIAGNOSIS — R41841 Cognitive communication deficit: Secondary | ICD-10-CM | POA: Diagnosis not present

## 2023-07-28 DIAGNOSIS — F0390 Unspecified dementia without behavioral disturbance: Secondary | ICD-10-CM | POA: Diagnosis not present

## 2023-07-28 DIAGNOSIS — I10 Essential (primary) hypertension: Secondary | ICD-10-CM | POA: Diagnosis not present

## 2023-07-28 DIAGNOSIS — R41841 Cognitive communication deficit: Secondary | ICD-10-CM | POA: Diagnosis not present

## 2023-07-31 DIAGNOSIS — F0390 Unspecified dementia without behavioral disturbance: Secondary | ICD-10-CM | POA: Diagnosis not present

## 2023-07-31 DIAGNOSIS — I10 Essential (primary) hypertension: Secondary | ICD-10-CM | POA: Diagnosis not present

## 2023-07-31 DIAGNOSIS — R41841 Cognitive communication deficit: Secondary | ICD-10-CM | POA: Diagnosis not present

## 2023-08-01 DIAGNOSIS — I10 Essential (primary) hypertension: Secondary | ICD-10-CM | POA: Diagnosis not present

## 2023-08-01 DIAGNOSIS — R41841 Cognitive communication deficit: Secondary | ICD-10-CM | POA: Diagnosis not present

## 2023-08-01 DIAGNOSIS — F0390 Unspecified dementia without behavioral disturbance: Secondary | ICD-10-CM | POA: Diagnosis not present

## 2023-08-03 DIAGNOSIS — I10 Essential (primary) hypertension: Secondary | ICD-10-CM | POA: Diagnosis not present

## 2023-08-03 DIAGNOSIS — F0390 Unspecified dementia without behavioral disturbance: Secondary | ICD-10-CM | POA: Diagnosis not present

## 2023-08-03 DIAGNOSIS — R41841 Cognitive communication deficit: Secondary | ICD-10-CM | POA: Diagnosis not present

## 2023-08-04 DIAGNOSIS — F0390 Unspecified dementia without behavioral disturbance: Secondary | ICD-10-CM | POA: Diagnosis not present

## 2023-08-04 DIAGNOSIS — Z23 Encounter for immunization: Secondary | ICD-10-CM | POA: Diagnosis not present

## 2023-08-04 DIAGNOSIS — R41841 Cognitive communication deficit: Secondary | ICD-10-CM | POA: Diagnosis not present

## 2023-08-04 DIAGNOSIS — I10 Essential (primary) hypertension: Secondary | ICD-10-CM | POA: Diagnosis not present

## 2023-08-09 DIAGNOSIS — I10 Essential (primary) hypertension: Secondary | ICD-10-CM | POA: Diagnosis not present

## 2023-08-09 DIAGNOSIS — R41841 Cognitive communication deficit: Secondary | ICD-10-CM | POA: Diagnosis not present

## 2023-08-09 DIAGNOSIS — F0390 Unspecified dementia without behavioral disturbance: Secondary | ICD-10-CM | POA: Diagnosis not present

## 2023-08-10 DIAGNOSIS — F0390 Unspecified dementia without behavioral disturbance: Secondary | ICD-10-CM | POA: Diagnosis not present

## 2023-08-10 DIAGNOSIS — R41841 Cognitive communication deficit: Secondary | ICD-10-CM | POA: Diagnosis not present

## 2023-08-10 DIAGNOSIS — I10 Essential (primary) hypertension: Secondary | ICD-10-CM | POA: Diagnosis not present

## 2023-08-11 DIAGNOSIS — R41841 Cognitive communication deficit: Secondary | ICD-10-CM | POA: Diagnosis not present

## 2023-08-11 DIAGNOSIS — F0390 Unspecified dementia without behavioral disturbance: Secondary | ICD-10-CM | POA: Diagnosis not present

## 2023-08-11 DIAGNOSIS — I10 Essential (primary) hypertension: Secondary | ICD-10-CM | POA: Diagnosis not present

## 2023-08-14 DIAGNOSIS — I10 Essential (primary) hypertension: Secondary | ICD-10-CM | POA: Diagnosis not present

## 2023-08-14 DIAGNOSIS — R41841 Cognitive communication deficit: Secondary | ICD-10-CM | POA: Diagnosis not present

## 2023-08-14 DIAGNOSIS — F0390 Unspecified dementia without behavioral disturbance: Secondary | ICD-10-CM | POA: Diagnosis not present

## 2023-08-15 DIAGNOSIS — F0390 Unspecified dementia without behavioral disturbance: Secondary | ICD-10-CM | POA: Diagnosis not present

## 2023-08-15 DIAGNOSIS — R41841 Cognitive communication deficit: Secondary | ICD-10-CM | POA: Diagnosis not present

## 2023-08-15 DIAGNOSIS — I10 Essential (primary) hypertension: Secondary | ICD-10-CM | POA: Diagnosis not present

## 2023-08-17 DIAGNOSIS — R41841 Cognitive communication deficit: Secondary | ICD-10-CM | POA: Diagnosis not present

## 2023-08-17 DIAGNOSIS — F0390 Unspecified dementia without behavioral disturbance: Secondary | ICD-10-CM | POA: Diagnosis not present

## 2023-08-17 DIAGNOSIS — I10 Essential (primary) hypertension: Secondary | ICD-10-CM | POA: Diagnosis not present

## 2023-08-18 DIAGNOSIS — I10 Essential (primary) hypertension: Secondary | ICD-10-CM | POA: Diagnosis not present

## 2023-08-18 DIAGNOSIS — R41841 Cognitive communication deficit: Secondary | ICD-10-CM | POA: Diagnosis not present

## 2023-08-18 DIAGNOSIS — F0390 Unspecified dementia without behavioral disturbance: Secondary | ICD-10-CM | POA: Diagnosis not present

## 2023-08-21 DIAGNOSIS — I10 Essential (primary) hypertension: Secondary | ICD-10-CM | POA: Diagnosis not present

## 2023-08-21 DIAGNOSIS — R41841 Cognitive communication deficit: Secondary | ICD-10-CM | POA: Diagnosis not present

## 2023-08-21 DIAGNOSIS — F0390 Unspecified dementia without behavioral disturbance: Secondary | ICD-10-CM | POA: Diagnosis not present

## 2023-08-23 DIAGNOSIS — F0390 Unspecified dementia without behavioral disturbance: Secondary | ICD-10-CM | POA: Diagnosis not present

## 2023-08-23 DIAGNOSIS — I10 Essential (primary) hypertension: Secondary | ICD-10-CM | POA: Diagnosis not present

## 2023-08-23 DIAGNOSIS — R41841 Cognitive communication deficit: Secondary | ICD-10-CM | POA: Diagnosis not present

## 2023-08-24 DIAGNOSIS — R41841 Cognitive communication deficit: Secondary | ICD-10-CM | POA: Diagnosis not present

## 2023-08-24 DIAGNOSIS — I10 Essential (primary) hypertension: Secondary | ICD-10-CM | POA: Diagnosis not present

## 2023-08-24 DIAGNOSIS — F0390 Unspecified dementia without behavioral disturbance: Secondary | ICD-10-CM | POA: Diagnosis not present

## 2023-08-25 DIAGNOSIS — I10 Essential (primary) hypertension: Secondary | ICD-10-CM | POA: Diagnosis not present

## 2023-08-25 DIAGNOSIS — F0390 Unspecified dementia without behavioral disturbance: Secondary | ICD-10-CM | POA: Diagnosis not present

## 2023-08-25 DIAGNOSIS — R41841 Cognitive communication deficit: Secondary | ICD-10-CM | POA: Diagnosis not present

## 2023-08-28 DIAGNOSIS — F0390 Unspecified dementia without behavioral disturbance: Secondary | ICD-10-CM | POA: Diagnosis not present

## 2023-08-28 DIAGNOSIS — I10 Essential (primary) hypertension: Secondary | ICD-10-CM | POA: Diagnosis not present

## 2023-08-28 DIAGNOSIS — R41841 Cognitive communication deficit: Secondary | ICD-10-CM | POA: Diagnosis not present

## 2023-08-29 DIAGNOSIS — R41841 Cognitive communication deficit: Secondary | ICD-10-CM | POA: Diagnosis not present

## 2023-08-29 DIAGNOSIS — F0390 Unspecified dementia without behavioral disturbance: Secondary | ICD-10-CM | POA: Diagnosis not present

## 2023-08-29 DIAGNOSIS — I10 Essential (primary) hypertension: Secondary | ICD-10-CM | POA: Diagnosis not present

## 2023-08-31 DIAGNOSIS — F0390 Unspecified dementia without behavioral disturbance: Secondary | ICD-10-CM | POA: Diagnosis not present

## 2023-08-31 DIAGNOSIS — I10 Essential (primary) hypertension: Secondary | ICD-10-CM | POA: Diagnosis not present

## 2023-08-31 DIAGNOSIS — R41841 Cognitive communication deficit: Secondary | ICD-10-CM | POA: Diagnosis not present

## 2023-09-01 DIAGNOSIS — F0390 Unspecified dementia without behavioral disturbance: Secondary | ICD-10-CM | POA: Diagnosis not present

## 2023-09-01 DIAGNOSIS — R41841 Cognitive communication deficit: Secondary | ICD-10-CM | POA: Diagnosis not present

## 2023-09-01 DIAGNOSIS — I10 Essential (primary) hypertension: Secondary | ICD-10-CM | POA: Diagnosis not present

## 2023-12-01 DIAGNOSIS — G301 Alzheimer's disease with late onset: Secondary | ICD-10-CM | POA: Diagnosis not present

## 2023-12-01 DIAGNOSIS — E7849 Other hyperlipidemia: Secondary | ICD-10-CM | POA: Diagnosis not present

## 2023-12-01 DIAGNOSIS — K219 Gastro-esophageal reflux disease without esophagitis: Secondary | ICD-10-CM | POA: Diagnosis not present

## 2023-12-01 DIAGNOSIS — F02B0 Dementia in other diseases classified elsewhere, moderate, without behavioral disturbance, psychotic disturbance, mood disturbance, and anxiety: Secondary | ICD-10-CM | POA: Diagnosis not present

## 2023-12-01 DIAGNOSIS — I10 Essential (primary) hypertension: Secondary | ICD-10-CM | POA: Diagnosis not present

## 2024-01-27 DIAGNOSIS — I10 Essential (primary) hypertension: Secondary | ICD-10-CM | POA: Diagnosis not present

## 2024-01-27 DIAGNOSIS — F0390 Unspecified dementia without behavioral disturbance: Secondary | ICD-10-CM | POA: Diagnosis not present

## 2024-01-27 DIAGNOSIS — M24441 Recurrent dislocation, right hand: Secondary | ICD-10-CM | POA: Diagnosis not present

## 2024-01-27 DIAGNOSIS — R69 Illness, unspecified: Secondary | ICD-10-CM | POA: Diagnosis not present

## 2024-01-27 DIAGNOSIS — M25531 Pain in right wrist: Secondary | ICD-10-CM | POA: Diagnosis not present

## 2024-01-27 DIAGNOSIS — M11241 Other chondrocalcinosis, right hand: Secondary | ICD-10-CM | POA: Diagnosis not present

## 2024-01-27 DIAGNOSIS — Z79899 Other long term (current) drug therapy: Secondary | ICD-10-CM | POA: Diagnosis not present

## 2024-01-27 DIAGNOSIS — M19031 Primary osteoarthritis, right wrist: Secondary | ICD-10-CM | POA: Diagnosis not present

## 2024-01-27 DIAGNOSIS — M25431 Effusion, right wrist: Secondary | ICD-10-CM | POA: Diagnosis not present

## 2024-01-27 DIAGNOSIS — M11231 Other chondrocalcinosis, right wrist: Secondary | ICD-10-CM | POA: Diagnosis not present

## 2024-01-27 DIAGNOSIS — M19041 Primary osteoarthritis, right hand: Secondary | ICD-10-CM | POA: Diagnosis not present

## 2024-06-28 DIAGNOSIS — E7849 Other hyperlipidemia: Secondary | ICD-10-CM | POA: Diagnosis not present

## 2024-06-28 DIAGNOSIS — F02B0 Dementia in other diseases classified elsewhere, moderate, without behavioral disturbance, psychotic disturbance, mood disturbance, and anxiety: Secondary | ICD-10-CM | POA: Diagnosis not present

## 2024-06-28 DIAGNOSIS — I10 Essential (primary) hypertension: Secondary | ICD-10-CM | POA: Diagnosis not present

## 2024-06-28 DIAGNOSIS — K219 Gastro-esophageal reflux disease without esophagitis: Secondary | ICD-10-CM | POA: Diagnosis not present

## 2024-06-28 DIAGNOSIS — G301 Alzheimer's disease with late onset: Secondary | ICD-10-CM | POA: Diagnosis not present

## 2024-08-06 DIAGNOSIS — F419 Anxiety disorder, unspecified: Secondary | ICD-10-CM | POA: Diagnosis not present

## 2024-08-06 DIAGNOSIS — K2100 Gastro-esophageal reflux disease with esophagitis, without bleeding: Secondary | ICD-10-CM | POA: Diagnosis not present

## 2024-08-06 DIAGNOSIS — I10 Essential (primary) hypertension: Secondary | ICD-10-CM | POA: Diagnosis not present

## 2024-08-06 DIAGNOSIS — G309 Alzheimer's disease, unspecified: Secondary | ICD-10-CM | POA: Diagnosis not present

## 2024-08-06 DIAGNOSIS — K589 Irritable bowel syndrome without diarrhea: Secondary | ICD-10-CM | POA: Diagnosis not present

## 2024-08-06 DIAGNOSIS — R531 Weakness: Secondary | ICD-10-CM | POA: Diagnosis not present

## 2024-08-07 DIAGNOSIS — R531 Weakness: Secondary | ICD-10-CM | POA: Diagnosis not present

## 2024-08-07 DIAGNOSIS — K589 Irritable bowel syndrome without diarrhea: Secondary | ICD-10-CM | POA: Diagnosis not present

## 2024-08-07 DIAGNOSIS — I10 Essential (primary) hypertension: Secondary | ICD-10-CM | POA: Diagnosis not present

## 2024-08-07 DIAGNOSIS — Z23 Encounter for immunization: Secondary | ICD-10-CM | POA: Diagnosis not present

## 2024-08-07 DIAGNOSIS — G309 Alzheimer's disease, unspecified: Secondary | ICD-10-CM | POA: Diagnosis not present

## 2024-08-07 DIAGNOSIS — F419 Anxiety disorder, unspecified: Secondary | ICD-10-CM | POA: Diagnosis not present

## 2024-08-07 DIAGNOSIS — K2100 Gastro-esophageal reflux disease with esophagitis, without bleeding: Secondary | ICD-10-CM | POA: Diagnosis not present

## 2024-08-08 DIAGNOSIS — G309 Alzheimer's disease, unspecified: Secondary | ICD-10-CM | POA: Diagnosis not present

## 2024-08-08 DIAGNOSIS — I10 Essential (primary) hypertension: Secondary | ICD-10-CM | POA: Diagnosis not present

## 2024-08-08 DIAGNOSIS — K589 Irritable bowel syndrome without diarrhea: Secondary | ICD-10-CM | POA: Diagnosis not present

## 2024-08-08 DIAGNOSIS — R531 Weakness: Secondary | ICD-10-CM | POA: Diagnosis not present

## 2024-08-08 DIAGNOSIS — F419 Anxiety disorder, unspecified: Secondary | ICD-10-CM | POA: Diagnosis not present

## 2024-08-08 DIAGNOSIS — K2100 Gastro-esophageal reflux disease with esophagitis, without bleeding: Secondary | ICD-10-CM | POA: Diagnosis not present

## 2024-08-09 DIAGNOSIS — R531 Weakness: Secondary | ICD-10-CM | POA: Diagnosis not present

## 2024-08-09 DIAGNOSIS — F419 Anxiety disorder, unspecified: Secondary | ICD-10-CM | POA: Diagnosis not present

## 2024-08-09 DIAGNOSIS — K2100 Gastro-esophageal reflux disease with esophagitis, without bleeding: Secondary | ICD-10-CM | POA: Diagnosis not present

## 2024-08-09 DIAGNOSIS — G309 Alzheimer's disease, unspecified: Secondary | ICD-10-CM | POA: Diagnosis not present

## 2024-08-09 DIAGNOSIS — K589 Irritable bowel syndrome without diarrhea: Secondary | ICD-10-CM | POA: Diagnosis not present

## 2024-08-09 DIAGNOSIS — I10 Essential (primary) hypertension: Secondary | ICD-10-CM | POA: Diagnosis not present

## 2024-08-10 DIAGNOSIS — I10 Essential (primary) hypertension: Secondary | ICD-10-CM | POA: Diagnosis not present

## 2024-08-10 DIAGNOSIS — K589 Irritable bowel syndrome without diarrhea: Secondary | ICD-10-CM | POA: Diagnosis not present

## 2024-08-10 DIAGNOSIS — R531 Weakness: Secondary | ICD-10-CM | POA: Diagnosis not present

## 2024-08-10 DIAGNOSIS — G309 Alzheimer's disease, unspecified: Secondary | ICD-10-CM | POA: Diagnosis not present

## 2024-08-10 DIAGNOSIS — K2100 Gastro-esophageal reflux disease with esophagitis, without bleeding: Secondary | ICD-10-CM | POA: Diagnosis not present

## 2024-08-10 DIAGNOSIS — F419 Anxiety disorder, unspecified: Secondary | ICD-10-CM | POA: Diagnosis not present

## 2024-08-12 DIAGNOSIS — F419 Anxiety disorder, unspecified: Secondary | ICD-10-CM | POA: Diagnosis not present

## 2024-08-12 DIAGNOSIS — K589 Irritable bowel syndrome without diarrhea: Secondary | ICD-10-CM | POA: Diagnosis not present

## 2024-08-12 DIAGNOSIS — R531 Weakness: Secondary | ICD-10-CM | POA: Diagnosis not present

## 2024-08-12 DIAGNOSIS — K2100 Gastro-esophageal reflux disease with esophagitis, without bleeding: Secondary | ICD-10-CM | POA: Diagnosis not present

## 2024-08-12 DIAGNOSIS — I10 Essential (primary) hypertension: Secondary | ICD-10-CM | POA: Diagnosis not present

## 2024-08-12 DIAGNOSIS — G309 Alzheimer's disease, unspecified: Secondary | ICD-10-CM | POA: Diagnosis not present

## 2024-08-13 DIAGNOSIS — I10 Essential (primary) hypertension: Secondary | ICD-10-CM | POA: Diagnosis not present

## 2024-08-13 DIAGNOSIS — F419 Anxiety disorder, unspecified: Secondary | ICD-10-CM | POA: Diagnosis not present

## 2024-08-13 DIAGNOSIS — R531 Weakness: Secondary | ICD-10-CM | POA: Diagnosis not present

## 2024-08-13 DIAGNOSIS — K2100 Gastro-esophageal reflux disease with esophagitis, without bleeding: Secondary | ICD-10-CM | POA: Diagnosis not present

## 2024-08-13 DIAGNOSIS — G309 Alzheimer's disease, unspecified: Secondary | ICD-10-CM | POA: Diagnosis not present

## 2024-08-13 DIAGNOSIS — K589 Irritable bowel syndrome without diarrhea: Secondary | ICD-10-CM | POA: Diagnosis not present

## 2024-08-14 DIAGNOSIS — R531 Weakness: Secondary | ICD-10-CM | POA: Diagnosis not present

## 2024-08-14 DIAGNOSIS — F419 Anxiety disorder, unspecified: Secondary | ICD-10-CM | POA: Diagnosis not present

## 2024-08-14 DIAGNOSIS — G309 Alzheimer's disease, unspecified: Secondary | ICD-10-CM | POA: Diagnosis not present

## 2024-08-14 DIAGNOSIS — K2100 Gastro-esophageal reflux disease with esophagitis, without bleeding: Secondary | ICD-10-CM | POA: Diagnosis not present

## 2024-08-14 DIAGNOSIS — I10 Essential (primary) hypertension: Secondary | ICD-10-CM | POA: Diagnosis not present

## 2024-08-14 DIAGNOSIS — K589 Irritable bowel syndrome without diarrhea: Secondary | ICD-10-CM | POA: Diagnosis not present

## 2024-08-15 DIAGNOSIS — F419 Anxiety disorder, unspecified: Secondary | ICD-10-CM | POA: Diagnosis not present

## 2024-08-15 DIAGNOSIS — K589 Irritable bowel syndrome without diarrhea: Secondary | ICD-10-CM | POA: Diagnosis not present

## 2024-08-15 DIAGNOSIS — I10 Essential (primary) hypertension: Secondary | ICD-10-CM | POA: Diagnosis not present

## 2024-08-15 DIAGNOSIS — K2100 Gastro-esophageal reflux disease with esophagitis, without bleeding: Secondary | ICD-10-CM | POA: Diagnosis not present

## 2024-08-15 DIAGNOSIS — R531 Weakness: Secondary | ICD-10-CM | POA: Diagnosis not present

## 2024-08-15 DIAGNOSIS — G309 Alzheimer's disease, unspecified: Secondary | ICD-10-CM | POA: Diagnosis not present

## 2024-08-16 DIAGNOSIS — K2100 Gastro-esophageal reflux disease with esophagitis, without bleeding: Secondary | ICD-10-CM | POA: Diagnosis not present

## 2024-08-16 DIAGNOSIS — I10 Essential (primary) hypertension: Secondary | ICD-10-CM | POA: Diagnosis not present

## 2024-08-16 DIAGNOSIS — F419 Anxiety disorder, unspecified: Secondary | ICD-10-CM | POA: Diagnosis not present

## 2024-08-16 DIAGNOSIS — R531 Weakness: Secondary | ICD-10-CM | POA: Diagnosis not present

## 2024-08-16 DIAGNOSIS — G309 Alzheimer's disease, unspecified: Secondary | ICD-10-CM | POA: Diagnosis not present

## 2024-08-16 DIAGNOSIS — K589 Irritable bowel syndrome without diarrhea: Secondary | ICD-10-CM | POA: Diagnosis not present

## 2024-08-17 DIAGNOSIS — R531 Weakness: Secondary | ICD-10-CM | POA: Diagnosis not present

## 2024-08-17 DIAGNOSIS — K589 Irritable bowel syndrome without diarrhea: Secondary | ICD-10-CM | POA: Diagnosis not present

## 2024-08-17 DIAGNOSIS — I10 Essential (primary) hypertension: Secondary | ICD-10-CM | POA: Diagnosis not present

## 2024-08-17 DIAGNOSIS — G309 Alzheimer's disease, unspecified: Secondary | ICD-10-CM | POA: Diagnosis not present

## 2024-08-17 DIAGNOSIS — F419 Anxiety disorder, unspecified: Secondary | ICD-10-CM | POA: Diagnosis not present

## 2024-08-17 DIAGNOSIS — K2100 Gastro-esophageal reflux disease with esophagitis, without bleeding: Secondary | ICD-10-CM | POA: Diagnosis not present

## 2024-08-19 DIAGNOSIS — G309 Alzheimer's disease, unspecified: Secondary | ICD-10-CM | POA: Diagnosis not present

## 2024-08-19 DIAGNOSIS — F419 Anxiety disorder, unspecified: Secondary | ICD-10-CM | POA: Diagnosis not present

## 2024-08-19 DIAGNOSIS — K589 Irritable bowel syndrome without diarrhea: Secondary | ICD-10-CM | POA: Diagnosis not present

## 2024-08-19 DIAGNOSIS — I10 Essential (primary) hypertension: Secondary | ICD-10-CM | POA: Diagnosis not present

## 2024-08-19 DIAGNOSIS — K2100 Gastro-esophageal reflux disease with esophagitis, without bleeding: Secondary | ICD-10-CM | POA: Diagnosis not present

## 2024-08-19 DIAGNOSIS — R531 Weakness: Secondary | ICD-10-CM | POA: Diagnosis not present

## 2024-08-20 DIAGNOSIS — K589 Irritable bowel syndrome without diarrhea: Secondary | ICD-10-CM | POA: Diagnosis not present

## 2024-08-20 DIAGNOSIS — F419 Anxiety disorder, unspecified: Secondary | ICD-10-CM | POA: Diagnosis not present

## 2024-08-20 DIAGNOSIS — K2100 Gastro-esophageal reflux disease with esophagitis, without bleeding: Secondary | ICD-10-CM | POA: Diagnosis not present

## 2024-08-20 DIAGNOSIS — I10 Essential (primary) hypertension: Secondary | ICD-10-CM | POA: Diagnosis not present

## 2024-08-20 DIAGNOSIS — G309 Alzheimer's disease, unspecified: Secondary | ICD-10-CM | POA: Diagnosis not present

## 2024-08-20 DIAGNOSIS — R531 Weakness: Secondary | ICD-10-CM | POA: Diagnosis not present

## 2024-08-21 DIAGNOSIS — G309 Alzheimer's disease, unspecified: Secondary | ICD-10-CM | POA: Diagnosis not present

## 2024-08-21 DIAGNOSIS — K2100 Gastro-esophageal reflux disease with esophagitis, without bleeding: Secondary | ICD-10-CM | POA: Diagnosis not present

## 2024-08-21 DIAGNOSIS — K589 Irritable bowel syndrome without diarrhea: Secondary | ICD-10-CM | POA: Diagnosis not present

## 2024-08-21 DIAGNOSIS — R531 Weakness: Secondary | ICD-10-CM | POA: Diagnosis not present

## 2024-08-21 DIAGNOSIS — F419 Anxiety disorder, unspecified: Secondary | ICD-10-CM | POA: Diagnosis not present

## 2024-08-21 DIAGNOSIS — I10 Essential (primary) hypertension: Secondary | ICD-10-CM | POA: Diagnosis not present

## 2024-08-22 DIAGNOSIS — G309 Alzheimer's disease, unspecified: Secondary | ICD-10-CM | POA: Diagnosis not present

## 2024-08-22 DIAGNOSIS — R531 Weakness: Secondary | ICD-10-CM | POA: Diagnosis not present

## 2024-08-22 DIAGNOSIS — K2100 Gastro-esophageal reflux disease with esophagitis, without bleeding: Secondary | ICD-10-CM | POA: Diagnosis not present

## 2024-08-22 DIAGNOSIS — K589 Irritable bowel syndrome without diarrhea: Secondary | ICD-10-CM | POA: Diagnosis not present

## 2024-08-22 DIAGNOSIS — I10 Essential (primary) hypertension: Secondary | ICD-10-CM | POA: Diagnosis not present

## 2024-08-22 DIAGNOSIS — F419 Anxiety disorder, unspecified: Secondary | ICD-10-CM | POA: Diagnosis not present

## 2024-08-23 DIAGNOSIS — F419 Anxiety disorder, unspecified: Secondary | ICD-10-CM | POA: Diagnosis not present

## 2024-08-23 DIAGNOSIS — K589 Irritable bowel syndrome without diarrhea: Secondary | ICD-10-CM | POA: Diagnosis not present

## 2024-08-23 DIAGNOSIS — R531 Weakness: Secondary | ICD-10-CM | POA: Diagnosis not present

## 2024-08-23 DIAGNOSIS — G309 Alzheimer's disease, unspecified: Secondary | ICD-10-CM | POA: Diagnosis not present

## 2024-08-23 DIAGNOSIS — K2100 Gastro-esophageal reflux disease with esophagitis, without bleeding: Secondary | ICD-10-CM | POA: Diagnosis not present

## 2024-08-23 DIAGNOSIS — I10 Essential (primary) hypertension: Secondary | ICD-10-CM | POA: Diagnosis not present

## 2024-08-26 DIAGNOSIS — G309 Alzheimer's disease, unspecified: Secondary | ICD-10-CM | POA: Diagnosis not present

## 2024-08-26 DIAGNOSIS — R531 Weakness: Secondary | ICD-10-CM | POA: Diagnosis not present

## 2024-08-26 DIAGNOSIS — K2100 Gastro-esophageal reflux disease with esophagitis, without bleeding: Secondary | ICD-10-CM | POA: Diagnosis not present

## 2024-08-26 DIAGNOSIS — F419 Anxiety disorder, unspecified: Secondary | ICD-10-CM | POA: Diagnosis not present

## 2024-08-26 DIAGNOSIS — K589 Irritable bowel syndrome without diarrhea: Secondary | ICD-10-CM | POA: Diagnosis not present

## 2024-08-26 DIAGNOSIS — I10 Essential (primary) hypertension: Secondary | ICD-10-CM | POA: Diagnosis not present

## 2024-08-27 DIAGNOSIS — K589 Irritable bowel syndrome without diarrhea: Secondary | ICD-10-CM | POA: Diagnosis not present

## 2024-08-27 DIAGNOSIS — K2100 Gastro-esophageal reflux disease with esophagitis, without bleeding: Secondary | ICD-10-CM | POA: Diagnosis not present

## 2024-08-27 DIAGNOSIS — G309 Alzheimer's disease, unspecified: Secondary | ICD-10-CM | POA: Diagnosis not present

## 2024-08-27 DIAGNOSIS — I10 Essential (primary) hypertension: Secondary | ICD-10-CM | POA: Diagnosis not present

## 2024-08-27 DIAGNOSIS — R531 Weakness: Secondary | ICD-10-CM | POA: Diagnosis not present

## 2024-08-27 DIAGNOSIS — F419 Anxiety disorder, unspecified: Secondary | ICD-10-CM | POA: Diagnosis not present

## 2024-08-28 DIAGNOSIS — F419 Anxiety disorder, unspecified: Secondary | ICD-10-CM | POA: Diagnosis not present

## 2024-08-28 DIAGNOSIS — R531 Weakness: Secondary | ICD-10-CM | POA: Diagnosis not present

## 2024-08-28 DIAGNOSIS — K2100 Gastro-esophageal reflux disease with esophagitis, without bleeding: Secondary | ICD-10-CM | POA: Diagnosis not present

## 2024-08-28 DIAGNOSIS — I10 Essential (primary) hypertension: Secondary | ICD-10-CM | POA: Diagnosis not present

## 2024-08-28 DIAGNOSIS — G309 Alzheimer's disease, unspecified: Secondary | ICD-10-CM | POA: Diagnosis not present

## 2024-08-28 DIAGNOSIS — K589 Irritable bowel syndrome without diarrhea: Secondary | ICD-10-CM | POA: Diagnosis not present

## 2024-08-29 DIAGNOSIS — F419 Anxiety disorder, unspecified: Secondary | ICD-10-CM | POA: Diagnosis not present

## 2024-08-29 DIAGNOSIS — G309 Alzheimer's disease, unspecified: Secondary | ICD-10-CM | POA: Diagnosis not present

## 2024-08-29 DIAGNOSIS — K2100 Gastro-esophageal reflux disease with esophagitis, without bleeding: Secondary | ICD-10-CM | POA: Diagnosis not present

## 2024-08-29 DIAGNOSIS — R531 Weakness: Secondary | ICD-10-CM | POA: Diagnosis not present

## 2024-08-29 DIAGNOSIS — I10 Essential (primary) hypertension: Secondary | ICD-10-CM | POA: Diagnosis not present

## 2024-08-29 DIAGNOSIS — K589 Irritable bowel syndrome without diarrhea: Secondary | ICD-10-CM | POA: Diagnosis not present

## 2024-08-30 DIAGNOSIS — R531 Weakness: Secondary | ICD-10-CM | POA: Diagnosis not present

## 2024-08-30 DIAGNOSIS — F419 Anxiety disorder, unspecified: Secondary | ICD-10-CM | POA: Diagnosis not present

## 2024-08-30 DIAGNOSIS — G309 Alzheimer's disease, unspecified: Secondary | ICD-10-CM | POA: Diagnosis not present

## 2024-08-30 DIAGNOSIS — K2100 Gastro-esophageal reflux disease with esophagitis, without bleeding: Secondary | ICD-10-CM | POA: Diagnosis not present

## 2024-08-30 DIAGNOSIS — I10 Essential (primary) hypertension: Secondary | ICD-10-CM | POA: Diagnosis not present

## 2024-08-30 DIAGNOSIS — Z23 Encounter for immunization: Secondary | ICD-10-CM | POA: Diagnosis not present

## 2024-08-30 DIAGNOSIS — K589 Irritable bowel syndrome without diarrhea: Secondary | ICD-10-CM | POA: Diagnosis not present

## 2024-09-01 DIAGNOSIS — R531 Weakness: Secondary | ICD-10-CM | POA: Diagnosis not present

## 2024-09-01 DIAGNOSIS — G8911 Acute pain due to trauma: Secondary | ICD-10-CM | POA: Diagnosis not present

## 2024-09-01 DIAGNOSIS — K2100 Gastro-esophageal reflux disease with esophagitis, without bleeding: Secondary | ICD-10-CM | POA: Diagnosis not present

## 2024-09-01 DIAGNOSIS — I10 Essential (primary) hypertension: Secondary | ICD-10-CM | POA: Diagnosis not present

## 2024-09-01 DIAGNOSIS — G309 Alzheimer's disease, unspecified: Secondary | ICD-10-CM | POA: Diagnosis not present

## 2024-09-01 DIAGNOSIS — Z043 Encounter for examination and observation following other accident: Secondary | ICD-10-CM | POA: Diagnosis not present

## 2024-09-01 DIAGNOSIS — M47812 Spondylosis without myelopathy or radiculopathy, cervical region: Secondary | ICD-10-CM | POA: Diagnosis not present

## 2024-09-01 DIAGNOSIS — W1830XA Fall on same level, unspecified, initial encounter: Secondary | ICD-10-CM | POA: Diagnosis not present

## 2024-09-01 DIAGNOSIS — K589 Irritable bowel syndrome without diarrhea: Secondary | ICD-10-CM | POA: Diagnosis not present

## 2024-09-01 DIAGNOSIS — R079 Chest pain, unspecified: Secondary | ICD-10-CM | POA: Diagnosis not present

## 2024-09-01 DIAGNOSIS — S0181XA Laceration without foreign body of other part of head, initial encounter: Secondary | ICD-10-CM | POA: Diagnosis not present

## 2024-09-01 DIAGNOSIS — W19XXXA Unspecified fall, initial encounter: Secondary | ICD-10-CM | POA: Diagnosis not present

## 2024-09-01 DIAGNOSIS — F419 Anxiety disorder, unspecified: Secondary | ICD-10-CM | POA: Diagnosis not present

## 2024-09-01 DIAGNOSIS — S0990XA Unspecified injury of head, initial encounter: Secondary | ICD-10-CM | POA: Diagnosis not present

## 2024-09-01 DIAGNOSIS — G319 Degenerative disease of nervous system, unspecified: Secondary | ICD-10-CM | POA: Diagnosis not present

## 2024-09-02 DIAGNOSIS — K589 Irritable bowel syndrome without diarrhea: Secondary | ICD-10-CM | POA: Diagnosis not present

## 2024-09-02 DIAGNOSIS — R531 Weakness: Secondary | ICD-10-CM | POA: Diagnosis not present

## 2024-09-02 DIAGNOSIS — I10 Essential (primary) hypertension: Secondary | ICD-10-CM | POA: Diagnosis not present

## 2024-09-02 DIAGNOSIS — K2100 Gastro-esophageal reflux disease with esophagitis, without bleeding: Secondary | ICD-10-CM | POA: Diagnosis not present

## 2024-09-02 DIAGNOSIS — G309 Alzheimer's disease, unspecified: Secondary | ICD-10-CM | POA: Diagnosis not present

## 2024-09-02 DIAGNOSIS — F419 Anxiety disorder, unspecified: Secondary | ICD-10-CM | POA: Diagnosis not present

## 2024-09-03 DIAGNOSIS — K2100 Gastro-esophageal reflux disease with esophagitis, without bleeding: Secondary | ICD-10-CM | POA: Diagnosis not present

## 2024-09-03 DIAGNOSIS — R531 Weakness: Secondary | ICD-10-CM | POA: Diagnosis not present

## 2024-09-03 DIAGNOSIS — I10 Essential (primary) hypertension: Secondary | ICD-10-CM | POA: Diagnosis not present

## 2024-09-03 DIAGNOSIS — G309 Alzheimer's disease, unspecified: Secondary | ICD-10-CM | POA: Diagnosis not present

## 2024-09-03 DIAGNOSIS — F419 Anxiety disorder, unspecified: Secondary | ICD-10-CM | POA: Diagnosis not present

## 2024-09-03 DIAGNOSIS — K589 Irritable bowel syndrome without diarrhea: Secondary | ICD-10-CM | POA: Diagnosis not present

## 2024-09-04 DIAGNOSIS — F419 Anxiety disorder, unspecified: Secondary | ICD-10-CM | POA: Diagnosis not present

## 2024-09-04 DIAGNOSIS — G309 Alzheimer's disease, unspecified: Secondary | ICD-10-CM | POA: Diagnosis not present

## 2024-09-04 DIAGNOSIS — K2100 Gastro-esophageal reflux disease with esophagitis, without bleeding: Secondary | ICD-10-CM | POA: Diagnosis not present

## 2024-09-04 DIAGNOSIS — R531 Weakness: Secondary | ICD-10-CM | POA: Diagnosis not present

## 2024-09-04 DIAGNOSIS — I10 Essential (primary) hypertension: Secondary | ICD-10-CM | POA: Diagnosis not present

## 2024-09-04 DIAGNOSIS — K589 Irritable bowel syndrome without diarrhea: Secondary | ICD-10-CM | POA: Diagnosis not present

## 2024-09-05 DIAGNOSIS — K2100 Gastro-esophageal reflux disease with esophagitis, without bleeding: Secondary | ICD-10-CM | POA: Diagnosis not present

## 2024-09-05 DIAGNOSIS — F419 Anxiety disorder, unspecified: Secondary | ICD-10-CM | POA: Diagnosis not present

## 2024-09-05 DIAGNOSIS — R531 Weakness: Secondary | ICD-10-CM | POA: Diagnosis not present

## 2024-09-05 DIAGNOSIS — G309 Alzheimer's disease, unspecified: Secondary | ICD-10-CM | POA: Diagnosis not present

## 2024-09-05 DIAGNOSIS — I10 Essential (primary) hypertension: Secondary | ICD-10-CM | POA: Diagnosis not present

## 2024-09-05 DIAGNOSIS — K589 Irritable bowel syndrome without diarrhea: Secondary | ICD-10-CM | POA: Diagnosis not present

## 2024-09-06 DIAGNOSIS — K589 Irritable bowel syndrome without diarrhea: Secondary | ICD-10-CM | POA: Diagnosis not present

## 2024-09-06 DIAGNOSIS — R531 Weakness: Secondary | ICD-10-CM | POA: Diagnosis not present

## 2024-09-06 DIAGNOSIS — K2100 Gastro-esophageal reflux disease with esophagitis, without bleeding: Secondary | ICD-10-CM | POA: Diagnosis not present

## 2024-09-06 DIAGNOSIS — F419 Anxiety disorder, unspecified: Secondary | ICD-10-CM | POA: Diagnosis not present

## 2024-09-06 DIAGNOSIS — I10 Essential (primary) hypertension: Secondary | ICD-10-CM | POA: Diagnosis not present

## 2024-09-06 DIAGNOSIS — G309 Alzheimer's disease, unspecified: Secondary | ICD-10-CM | POA: Diagnosis not present

## 2024-09-09 DIAGNOSIS — K589 Irritable bowel syndrome without diarrhea: Secondary | ICD-10-CM | POA: Diagnosis not present

## 2024-09-09 DIAGNOSIS — K2100 Gastro-esophageal reflux disease with esophagitis, without bleeding: Secondary | ICD-10-CM | POA: Diagnosis not present

## 2024-09-09 DIAGNOSIS — R531 Weakness: Secondary | ICD-10-CM | POA: Diagnosis not present

## 2024-09-09 DIAGNOSIS — G309 Alzheimer's disease, unspecified: Secondary | ICD-10-CM | POA: Diagnosis not present

## 2024-09-09 DIAGNOSIS — F419 Anxiety disorder, unspecified: Secondary | ICD-10-CM | POA: Diagnosis not present

## 2024-09-09 DIAGNOSIS — I10 Essential (primary) hypertension: Secondary | ICD-10-CM | POA: Diagnosis not present

## 2024-09-10 DIAGNOSIS — I10 Essential (primary) hypertension: Secondary | ICD-10-CM | POA: Diagnosis not present

## 2024-09-10 DIAGNOSIS — G309 Alzheimer's disease, unspecified: Secondary | ICD-10-CM | POA: Diagnosis not present

## 2024-09-10 DIAGNOSIS — K589 Irritable bowel syndrome without diarrhea: Secondary | ICD-10-CM | POA: Diagnosis not present

## 2024-09-10 DIAGNOSIS — K2100 Gastro-esophageal reflux disease with esophagitis, without bleeding: Secondary | ICD-10-CM | POA: Diagnosis not present

## 2024-09-10 DIAGNOSIS — R531 Weakness: Secondary | ICD-10-CM | POA: Diagnosis not present

## 2024-09-10 DIAGNOSIS — F419 Anxiety disorder, unspecified: Secondary | ICD-10-CM | POA: Diagnosis not present

## 2024-09-11 DIAGNOSIS — G309 Alzheimer's disease, unspecified: Secondary | ICD-10-CM | POA: Diagnosis not present

## 2024-09-11 DIAGNOSIS — K589 Irritable bowel syndrome without diarrhea: Secondary | ICD-10-CM | POA: Diagnosis not present

## 2024-09-11 DIAGNOSIS — I10 Essential (primary) hypertension: Secondary | ICD-10-CM | POA: Diagnosis not present

## 2024-09-11 DIAGNOSIS — R531 Weakness: Secondary | ICD-10-CM | POA: Diagnosis not present

## 2024-09-11 DIAGNOSIS — F419 Anxiety disorder, unspecified: Secondary | ICD-10-CM | POA: Diagnosis not present

## 2024-09-11 DIAGNOSIS — K2100 Gastro-esophageal reflux disease with esophagitis, without bleeding: Secondary | ICD-10-CM | POA: Diagnosis not present

## 2024-09-13 DIAGNOSIS — K589 Irritable bowel syndrome without diarrhea: Secondary | ICD-10-CM | POA: Diagnosis not present

## 2024-09-13 DIAGNOSIS — I10 Essential (primary) hypertension: Secondary | ICD-10-CM | POA: Diagnosis not present

## 2024-09-13 DIAGNOSIS — F419 Anxiety disorder, unspecified: Secondary | ICD-10-CM | POA: Diagnosis not present

## 2024-09-13 DIAGNOSIS — K2100 Gastro-esophageal reflux disease with esophagitis, without bleeding: Secondary | ICD-10-CM | POA: Diagnosis not present

## 2024-09-13 DIAGNOSIS — G309 Alzheimer's disease, unspecified: Secondary | ICD-10-CM | POA: Diagnosis not present

## 2024-09-13 DIAGNOSIS — R531 Weakness: Secondary | ICD-10-CM | POA: Diagnosis not present
